# Patient Record
Sex: Female | Born: 1980 | Race: White | Hispanic: No | Marital: Married | Smoking: Never smoker
Health system: Southern US, Community
[De-identification: ages and names within clinical notes are randomized; demographics above are authoritative.]

## PROBLEM LIST (undated history)

## (undated) DIAGNOSIS — N200 Calculus of kidney: Secondary | ICD-10-CM

## (undated) DIAGNOSIS — B019 Varicella without complication: Secondary | ICD-10-CM

## (undated) DIAGNOSIS — N390 Urinary tract infection, site not specified: Secondary | ICD-10-CM

## (undated) HISTORY — DX: Calculus of kidney: N20.0

## (undated) HISTORY — DX: Varicella without complication: B01.9

## (undated) HISTORY — DX: Urinary tract infection, site not specified: N39.0

---

## 2005-01-07 ENCOUNTER — Emergency Department (HOSPITAL_COMMUNITY): Admission: EM | Admit: 2005-01-07 | Discharge: 2005-01-07 | Payer: Self-pay | Admitting: Emergency Medicine

## 2010-09-17 ENCOUNTER — Ambulatory Visit
Admission: RE | Admit: 2010-09-17 | Discharge: 2010-09-17 | Disposition: A | Discharge status: Home / Self Care | Payer: BC Managed Care – PPO | Source: Ambulatory Visit | Attending: Family Medicine | Admitting: Family Medicine

## 2010-09-17 ENCOUNTER — Other Ambulatory Visit: Payer: Self-pay | Admitting: Family Medicine

## 2010-09-17 DIAGNOSIS — M549 Dorsalgia, unspecified: Secondary | ICD-10-CM

## 2010-09-17 DIAGNOSIS — M545 Low back pain: Secondary | ICD-10-CM

## 2010-09-17 DIAGNOSIS — N3000 Acute cystitis without hematuria: Secondary | ICD-10-CM

## 2011-03-21 ENCOUNTER — Other Ambulatory Visit: Payer: Self-pay | Admitting: Sports Medicine

## 2011-03-21 DIAGNOSIS — M5412 Radiculopathy, cervical region: Secondary | ICD-10-CM

## 2011-03-25 ENCOUNTER — Ambulatory Visit
Admission: RE | Admit: 2011-03-25 | Discharge: 2011-03-25 | Disposition: A | Discharge status: Home / Self Care | Payer: BC Managed Care – PPO | Source: Ambulatory Visit | Attending: Sports Medicine | Admitting: Sports Medicine

## 2011-03-25 DIAGNOSIS — M5412 Radiculopathy, cervical region: Secondary | ICD-10-CM

## 2013-12-25 ENCOUNTER — Ambulatory Visit (INDEPENDENT_AMBULATORY_CARE_PROVIDER_SITE_OTHER): Payer: BC Managed Care – PPO | Admitting: Family Medicine

## 2013-12-25 ENCOUNTER — Encounter: Payer: Self-pay | Admitting: Family Medicine

## 2013-12-25 VITALS — BP 124/74 | HR 104 | Temp 98.4°F | Ht 62.6 in | Wt 221.0 lb

## 2013-12-25 DIAGNOSIS — R059 Cough, unspecified: Secondary | ICD-10-CM

## 2013-12-25 DIAGNOSIS — R053 Chronic cough: Secondary | ICD-10-CM

## 2013-12-25 DIAGNOSIS — R05 Cough: Secondary | ICD-10-CM

## 2013-12-25 DIAGNOSIS — K219 Gastro-esophageal reflux disease without esophagitis: Secondary | ICD-10-CM

## 2013-12-25 DIAGNOSIS — N200 Calculus of kidney: Secondary | ICD-10-CM

## 2013-12-25 NOTE — Patient Instructions (Signed)
Elevated head of bed about 6 to 8 inches Avoid eating within 2 -3 hours of bedtime Consider over the counter Nexium or Prilosec for the next few days   Gastroesophageal Reflux Disease, Adult Gastroesophageal reflux disease (GERD) happens when acid from your stomach flows up into the esophagus. When acid comes in contact with the esophagus, the acid causes soreness (inflammation) in the esophagus. Over time, GERD may create small holes (ulcers) in the lining of the esophagus. CAUSES   Increased body weight. This puts pressure on the stomach, making acid rise from the stomach into the esophagus.  Smoking. This increases acid production in the stomach.  Drinking alcohol. This causes decreased pressure in the lower esophageal sphincter (valve or ring of muscle between the esophagus and stomach), allowing acid from the stomach into the esophagus.  Late evening meals and a full stomach. This increases pressure and acid production in the stomach.  A malformed lower esophageal sphincter. Sometimes, no cause is found. SYMPTOMS   Burning pain in the lower part of the mid-chest behind the breastbone and in the mid-stomach area. This may occur twice a week or more often.  Trouble swallowing.  Sore throat.  Dry cough.  Asthma-like symptoms including chest tightness, shortness of breath, or wheezing. DIAGNOSIS  Your caregiver may be able to diagnose GERD based on your symptoms. In some cases, X-rays and other tests may be done to check for complications or to check the condition of your stomach and esophagus. TREATMENT  Your caregiver may recommend over-the-counter or prescription medicines to help decrease acid production. Ask your caregiver before starting or adding any new medicines.  HOME CARE INSTRUCTIONS   Change the factors that you can control. Ask your caregiver for guidance concerning weight loss, quitting smoking, and alcohol consumption.  Avoid foods and drinks that make your  symptoms worse, such as:  Caffeine or alcoholic drinks.  Chocolate.  Peppermint or mint flavorings.  Garlic and onions.  Spicy foods.  Citrus fruits, such as oranges, lemons, or limes.  Tomato-based foods such as sauce, chili, salsa, and pizza.  Fried and fatty foods.  Avoid lying down for the 3 hours prior to your bedtime or prior to taking a nap.  Eat small, frequent meals instead of large meals.  Wear loose-fitting clothing. Do not wear anything tight around your waist that causes pressure on your stomach.  Raise the head of your bed 6 to 8 inches with wood blocks to help you sleep. Extra pillows will not help.  Only take over-the-counter or prescription medicines for pain, discomfort, or fever as directed by your caregiver.  Do not take aspirin, ibuprofen, or other nonsteroidal anti-inflammatory drugs (NSAIDs). SEEK IMMEDIATE MEDICAL CARE IF:   You have pain in your arms, neck, jaw, teeth, or back.  Your pain increases or changes in intensity or duration.  You develop nausea, vomiting, or sweating (diaphoresis).  You develop shortness of breath, or you faint.  Your vomit is green, yellow, black, or looks like coffee grounds or blood.  Your stool is red, bloody, or black. These symptoms could be signs of other problems, such as heart disease, gastric bleeding, or esophageal bleeding. MAKE SURE YOU:   Understand these instructions.  Will watch your condition.  Will get help right away if you are not doing well or get worse. Document Released: 01/26/2005 Document Revised: 07/11/2011 Document Reviewed: 11/05/2010 [facility] Patient Information 2015 [place], [place]. This information is not intended to replace advice given to you by your health  care provider. Make sure you discuss any questions you have with your health care provider.  

## 2013-12-25 NOTE — Progress Notes (Signed)
Pre visit review using our clinic review tool, if applicable. No additional management support is needed unless otherwise documented below in the visit note. 

## 2013-12-25 NOTE — Progress Notes (Signed)
   Subjective:    Patient ID: Sharon Rosario, female    DOB: [birth year], 33 y.o.   MRN: [id number]  HPI Patient here to establish care. She has past history of kidney stones x2. No other chronic medical problems. Takes no regular medications. She's had some issues with menorrhagia over the past year and has seen her gynecologist. She's not on any hormone treatment at this time. She has never smoked.  Maj. issue is cough for about the past 6 weeks. She's had some chronic cough over the past. Never smoked. No clear etiology. No history of asthma. No obvious wheezing. No ACE use. No postnasal drip symptoms. She was treated by another physician with Zithromax and several allergy medicines without improvement. No asthma history. She does have some occasional GERD symptoms. Cough is day and night.  Cough occurs in multiple environments. No clear triggers. Denies any recent appetite changes. She's had gradual weight gain during the past year. She has several labs including thyroid functions from OB/GYN a little over a year ago reportedly normal.  Past Medical History  Diagnosis Date  . Chicken pox   . Kidney stone   . Urinary tract infection    History reviewed. No pertinent past surgical history.  reports that she has never smoked. She does not have any smokeless tobacco history on file. She reports that she drinks alcohol. She reports that she does not use illicit drugs. family history includes Arthritis in her maternal grandfather; Cancer in her paternal grandfather; Heart disease in her maternal grandmother. Allergies  Allergen Reactions  . Codeine   . Sulfa Antibiotics       Review of Systems  Constitutional: Negative for fever, chills, appetite change and unexpected weight change.  HENT: Negative for congestion, postnasal drip and voice change.   Respiratory: Positive for cough. Negative for shortness of breath and wheezing.   Cardiovascular: Negative for chest pain,  palpitations and leg swelling.  Neurological: Negative for dizziness and headaches.  Hematological: Negative for adenopathy.       Objective:   Physical Exam  Constitutional: She appears well-developed and well-nourished. No distress.  HENT:  Right Ear: External ear normal.  Left Ear: External ear normal.  Mouth/Throat: Oropharynx is clear and moist.  Neck: Neck supple. No thyromegaly present.  Cardiovascular: Normal rate and regular rhythm.   Pulmonary/Chest: Effort normal and breath sounds normal. No respiratory distress. She has no wheezes. She has no rales.  Musculoskeletal: She exhibits no edema.  Lymphadenopathy:    She has no cervical adenopathy.          Assessment & Plan:  Chronic cough. Suspect probable GERD component. Elevate head of bed 6-8 inches. Try over-the-counter Nexium or Prilosec for the next several weeks. GERD handout given. Minimize caffeine use. Chest x-ray -given duration of cough. Touch base if symptoms not resolving over the next few weeks. We discussed her high BMI and relevance to GERD risk and have recommended she lose some weight.

## 2013-12-26 DIAGNOSIS — K219 Gastro-esophageal reflux disease without esophagitis: Secondary | ICD-10-CM

## 2013-12-26 DIAGNOSIS — N200 Calculus of kidney: Secondary | ICD-10-CM

## 2013-12-26 DIAGNOSIS — E669 Obesity, unspecified: Secondary | ICD-10-CM | POA: Insufficient documentation

## 2013-12-26 HISTORY — DX: Calculus of kidney: N20.0

## 2013-12-26 HISTORY — DX: Obesity, unspecified: E66.9

## 2013-12-26 HISTORY — DX: Gastro-esophageal reflux disease without esophagitis: K21.9

## 2014-04-07 ENCOUNTER — Ambulatory Visit (INDEPENDENT_AMBULATORY_CARE_PROVIDER_SITE_OTHER): Payer: BC Managed Care – PPO | Admitting: Family Medicine

## 2014-04-07 ENCOUNTER — Telehealth: Payer: Self-pay | Admitting: Family Medicine

## 2014-04-07 ENCOUNTER — Encounter: Payer: Self-pay | Admitting: Family Medicine

## 2014-04-07 VITALS — BP 110/80 | HR 72 | Temp 98.2°F | Ht 62.6 in | Wt 223.7 lb

## 2014-04-07 DIAGNOSIS — J069 Acute upper respiratory infection, unspecified: Secondary | ICD-10-CM

## 2014-04-07 MED ORDER — AMOXICILLIN 875 MG PO TABS: 875.0000 mg | ORAL_TABLET | Freq: Two times a day (BID) | ORAL | Status: DC

## 2014-04-07 NOTE — Progress Notes (Signed)
Pre visit review using our clinic review tool, if applicable. No additional management support is needed unless otherwise documented below in the visit note. 

## 2014-04-07 NOTE — Progress Notes (Signed)
HPI:  -started: 9 days ago, started to get better, then got hoarse voice and cough and more nasal congestion -symptoms:nasal congestion, sore throat, cough -denies:fever, SOB, NVD, tooth pain -has tried: OTC medications -sick contacts/travel/risks: denies flu exposure, tick exposure or or Ebola risks  ROS: See pertinent positives and negatives per HPI.  Past Medical History  Diagnosis Date  . Chicken pox   . Kidney stone   . Urinary tract infection     No past surgical history on file.  Family History  Problem Relation Age of Onset  . Heart disease Maternal Grandmother   . Arthritis Maternal Grandfather   . Cancer Paternal Grandfather     colon    History   Social History  . Marital Status: Married    Spouse Name: N/A    Number of Children: N/A  . Years of Education: N/A   Social History Main Topics  . Smoking status: Never Smoker   . Smokeless tobacco: None  . Alcohol Use: Yes  . Drug Use: No  . Sexual Activity: None   Other Topics Concern  . None   Social History Narrative    Current outpatient prescriptions: amoxicillin (AMOXIL) 875 MG tablet, Take 1 tablet (875 mg total) by mouth 2 (two) times daily., Disp: 20 tablet, Rfl: 0  EXAM:  Filed Vitals:   04/07/14 1059  BP: 110/80  Pulse: 72  Temp: 98.2 F (36.8 C)    Body mass index is 40.14 kg/(m^2).  GENERAL: vitals reviewed and listed above, alert, oriented, appears well hydrated and in no acute distress  HEENT: atraumatic, conjunttiva clear, no obvious abnormalities on inspection of external nose and ears, normal appearance of ear canals and TMs, clear nasal congestion, mild post oropharyngeal erythema with PND, no tonsillar edema or exudate, no sinus TTP  NECK: no obvious masses on inspection  LUNGS: clear to auscultation bilaterally, no wheezes, rales or rhonchi, good air movement  CV: HRRR, no peripheral edema  MS: moves all extremities without noticeable abnormality  PSYCH: pleasant  and cooperative, no obvious depression or anxiety  ASSESSMENT AND PLAN:  Discussed the following assessment and plan:  Acute upper respiratory infection - Plan: amoxicillin (AMOXIL) 875 MG tablet  -given HPI and exam findings today, a serious infection or illness is unlikely. We discussed potential etiologies, with VURI being most likely, and advised supportive care and monitoring. We discussed treatment side effects, likely course, antibiotic misuse, transmission, and signs of developing a serious illness. She is concerned for bacterial sinus infection - doubt, but abx  incase not improving as expected or develops symptoms of this. -of course, we advised to return or notify a doctor immediately if symptoms worsen or persist or new concerns arise.    Patient Instructions  INSTRUCTIONS FOR UPPER RESPIRATORY INFECTION:  -plenty of rest and fluids  -nasal saline wash 2-3 times daily (use prepackaged nasal saline or bottled/distilled water if making your own)   -can use AFRIN nasal spray for drainage and nasal congestion - but do NOT use longer then 3-4 days  -use antibiotic only if not getting better in next few days or if worseneing; shred prescription if you do not use  -can use tylenol or ibuprofen as directed for aches and sorethroat  -in the winter time, using a humidifier at night is helpful (please follow cleaning instructions)  -if you are taking a cough medication - use only as directed, may also try a teaspoon of honey to coat the throat and throat  lozenges  -for sore throat, salt water gargles can help  -follow up if you have fevers, facial pain, tooth pain, difficulty breathing or are worsening or not getting better in 5-7 days      KIM, HANNAH R.

## 2014-04-07 NOTE — Patient Instructions (Signed)
INSTRUCTIONS FOR UPPER RESPIRATORY INFECTION:  -plenty of rest and fluids  -nasal saline wash 2-3 times daily (use prepackaged nasal saline or bottled/distilled water if making your own)   -can use AFRIN nasal spray for drainage and nasal congestion - but do NOT use longer then 3-4 days  -use antibiotic only if not getting better in next few days or if worseneing; shred prescription if you do not use  -can use tylenol or ibuprofen as directed for aches and sorethroat  -in the winter time, using a humidifier at night is helpful (please follow cleaning instructions)  -if you are taking a cough medication - use only as directed, may also try a teaspoon of honey to coat the throat and throat lozenges  -for sore throat, salt water gargles can help  -follow up if you have fevers, facial pain, tooth pain, difficulty breathing or are worsening or not getting better in 5-7 days

## 2014-04-07 NOTE — Telephone Encounter (Signed)
Pt needs receipt printed and faxed for proof of payment for reimbursement by her insurance company. Pls fax receipt to [phone] Pt states this goes directly to her.

## 2014-06-17 ENCOUNTER — Encounter: Payer: Self-pay | Admitting: Family Medicine

## 2014-06-17 ENCOUNTER — Ambulatory Visit (INDEPENDENT_AMBULATORY_CARE_PROVIDER_SITE_OTHER): Payer: BLUE CROSS/BLUE SHIELD | Admitting: Family Medicine

## 2014-06-17 VITALS — BP 124/80 | HR 107 | Temp 97.9°F | Wt 222.0 lb

## 2014-06-17 DIAGNOSIS — J029 Acute pharyngitis, unspecified: Secondary | ICD-10-CM

## 2014-06-17 LAB — POCT RAPID STREP A (OFFICE): Rapid Strep A Screen: NEGATIVE

## 2014-06-17 NOTE — Progress Notes (Signed)
Pre visit review using our clinic review tool, if applicable. No additional management support is needed unless otherwise documented below in the visit note. 

## 2014-06-17 NOTE — Patient Instructions (Signed)

## 2014-06-17 NOTE — Progress Notes (Signed)
   Subjective:    Patient ID: Sharon Rosario, female    DOB: 09/03/1980, 34 y.o.   MRN: [id number]  HPI Acute visit for sore throat and cough. Last Friday she developed fever up to 101 and had low-grade fever of the weekend. None today. She has cough which is mostly nonproductive. She has some mild body aches. She had some nausea and a couple episodes of vomiting over the weekend but none since then. She has sensation of swelling posterior pharynx. Is keeping down fluids without difficulty. Has some postnasal drainage. No skin rash. No diarrhea  Past Medical History  Diagnosis Date  . Chicken pox   . Kidney stone   . Urinary tract infection    No past surgical history on file.  reports that she has never smoked. She does not have any smokeless tobacco history on file. She reports that she drinks alcohol. She reports that she does not use illicit drugs. family history includes Arthritis in her maternal grandfather; Cancer in her paternal grandfather; Heart disease in her maternal grandmother. Allergies  Allergen Reactions  . Codeine   . Sulfa Antibiotics       Review of Systems  Constitutional: Positive for fatigue.  HENT: Positive for congestion and sore throat.   Respiratory: Positive for cough.   Musculoskeletal: Positive for myalgias.  Skin: Negative for rash.       Objective:   Physical Exam  Constitutional: She appears well-developed and well-nourished.  HENT:  Right Ear: External ear normal.  Left Ear: External ear normal.  Oropharynx posterior erythema. Mild swelling and erythema of uvula. Tonsils are symmetrically enlarged with mild erythema  Neck: Neck supple.  Minimal anterior cervical adenopathy bilaterally  Cardiovascular: Normal rate and regular rhythm.   No murmur heard. Pulmonary/Chest: Effort normal and breath sounds normal. No respiratory distress. She has no wheezes. She has no rales.          Assessment & Plan:  Acute pharyngitis.  Suspect viral syndrome. Check rapid strep. If negative, treat symptomatically

## 2015-02-03 ENCOUNTER — Ambulatory Visit (INDEPENDENT_AMBULATORY_CARE_PROVIDER_SITE_OTHER): Payer: BLUE CROSS/BLUE SHIELD | Admitting: Family Medicine

## 2015-02-03 ENCOUNTER — Encounter: Payer: Self-pay | Admitting: Family Medicine

## 2015-02-03 VITALS — BP 121/73 | HR 124 | Temp 100.3°F | Ht 63.0 in | Wt 226.0 lb

## 2015-02-03 DIAGNOSIS — J019 Acute sinusitis, unspecified: Secondary | ICD-10-CM | POA: Diagnosis not present

## 2015-02-03 MED ORDER — AZITHROMYCIN 250 MG PO TABS: ORAL_TABLET | ORAL | Status: DC

## 2015-02-03 NOTE — Progress Notes (Signed)
Pre visit review using our clinic review tool, if applicable. No additional management support is needed unless otherwise documented below in the visit note. 

## 2015-02-03 NOTE — Progress Notes (Signed)
   Subjective:    Patient ID: Sharon Rosario, female    DOB: 08-01-1980, 34 y.o.   MRN: [id number]  HPI Here for 4 days of sinus pressure, HA, left ear pain, PND, a ST , and a dry cough. Some fever. Taking Tylenol.    Review of Systems  Constitutional: Positive for fever and chills. Negative for diaphoresis.  HENT: Positive for congestion, postnasal drip and sinus pressure.   Eyes: Negative.   Respiratory: Positive for cough.        Objective:   Physical Exam  Constitutional: She appears well-developed and well-nourished.  HENT:  Right Ear: External ear normal.  Left Ear: External ear normal.  Nose: Nose normal.  Mouth/Throat: Oropharynx is clear and moist.  Eyes: Conjunctivae are normal.  Neck: Neck supple. No thyromegaly present.  Cardiovascular: Normal rate, regular rhythm, normal heart sounds and intact distal pulses.   Pulmonary/Chest: Effort normal and breath sounds normal.  Lymphadenopathy:    She has no cervical adenopathy.          Assessment & Plan:  Sinusitis, treat with a Zpack. Add Mucinex.

## 2015-02-20 ENCOUNTER — Ambulatory Visit (INDEPENDENT_AMBULATORY_CARE_PROVIDER_SITE_OTHER): Payer: BLUE CROSS/BLUE SHIELD | Admitting: Family Medicine

## 2015-02-20 ENCOUNTER — Encounter: Payer: Self-pay | Admitting: Family Medicine

## 2015-02-20 VITALS — BP 110/80 | HR 111 | Temp 101.3°F | Wt 223.0 lb

## 2015-02-20 DIAGNOSIS — R509 Fever, unspecified: Secondary | ICD-10-CM | POA: Diagnosis not present

## 2015-02-20 DIAGNOSIS — J209 Acute bronchitis, unspecified: Secondary | ICD-10-CM | POA: Diagnosis not present

## 2015-02-20 LAB — POCT RAPID STREP A (OFFICE): Rapid Strep A Screen: NEGATIVE

## 2015-02-20 LAB — POCT INFLUENZA A/B: Influenza A, POC: NEGATIVE

## 2015-02-20 MED ORDER — AMOXICILLIN-POT CLAVULANATE 875-125 MG PO TABS: 1.0000 | ORAL_TABLET | Freq: Two times a day (BID) | ORAL | Status: DC

## 2015-02-20 NOTE — Progress Notes (Signed)
   Subjective:    Patient ID: Sharon Rosario, female    DOB: 06/05/1980, 34 y.o.   MRN: [id number]  HPI Here for the onset yesterday of fever to 102.5 degrees, body aches, a ST, and a deep cough producing green sputum. She was treated for a sinus infection on 02-03-15 with a Zpack, and this resolved.    Review of Systems  Constitutional: Positive for fever.  HENT: Positive for congestion and sore throat. Negative for postnasal drip.   Eyes: Negative.   Respiratory: Positive for cough and chest tightness. Negative for shortness of breath and wheezing.   Cardiovascular: Negative.        Objective:   Physical Exam  Constitutional: She appears well-developed and well-nourished.  Appears ill   HENT:  Right Ear: External ear normal.  Left Ear: External ear normal.  Nose: Nose normal.  Mouth/Throat: Oropharynx is clear and moist.  Eyes: Conjunctivae are normal.  Neck: Neck supple. No thyromegaly present.  Cardiovascular: Normal rate, regular rhythm, normal heart sounds and intact distal pulses.   Pulmonary/Chest: Effort normal. No respiratory distress. She has no wheezes. She has no rales.  Scattered rhonchi  Lymphadenopathy:    She has no cervical adenopathy.          Assessment & Plan:  Bronchitis, treat with Augmentin

## 2015-02-20 NOTE — Progress Notes (Signed)
Pre visit review using our clinic review tool, if applicable. No additional management support is needed unless otherwise documented below in the visit note. Influenza immunization was not given due to patient is ill.  

## 2015-02-23 ENCOUNTER — Telehealth: Payer: Self-pay | Admitting: Family Medicine

## 2015-02-23 MED ORDER — METHYLPREDNISOLONE 4 MG PO TBPK: ORAL_TABLET | ORAL | Status: DC

## 2015-02-23 MED ORDER — LEVOFLOXACIN 500 MG PO TABS: 500.0000 mg | ORAL_TABLET | Freq: Every day | ORAL | Status: DC

## 2015-02-23 NOTE — Telephone Encounter (Signed)
Patient Name: Sharon Rosario  DOB: 05/30/1980    Initial Comment caller states she recv'd amoxicillin from the dr - now both of her hands are swollen and has a rash on hands and arms   Nurse Assessment  Nurse: Stefano GaulStringer, RN, Dwana CurdVera Date/Time (Eastern Time): 02/23/2015 9:49:46 AM  Confirm and document reason for call. If symptomatic, describe symptoms. ---Caller states she has been taking amoxicillin for bronchitis. Has little red dots on her hands and arms and back. Hands are very swollen. Bumps are red and raised. Hands are painful. Stopped amoxicillin yesterday at 1 pm. [place] Started amoxicillin Friday evening.  Has the patient traveled out of the country within the last 30 days? ---No  Does the patient have any new or worsening symptoms? ---Yes  Will a triage be completed? ---Yes  Related visit to physician within the last 2 weeks? ---No  Does the PT have any chronic conditions? (i.e. diabetes, asthma, etc.) ---No  Did the patient indicate they were pregnant? ---No     Guidelines    Guideline Title Affirmed Question Affirmed Notes  Rash - Widespread On Drugs [1] Purple or blood-colored rash (spots or dots) AND [2] no fever AND [3] sounds well to triager    Final Disposition User   See Physician within 4 Hours (or PCP triage) Stefano GaulStringer, RN, Vera    Comments  Pt states [place] and is not going to urgent care or ER. Wants a new antibiotic called in for her bronchitis [address] [facility] at [address]; [place], [place]; [id number]; phone: [phone]   Referrals  GO TO FACILITY REFUSED   Disagree/Comply: Disagree  Disagree/Comply Reason: Disagree with instructions

## 2015-02-23 NOTE — Telephone Encounter (Signed)
I added Amoxicillin to her Allergy list. Tell her to take Benadryl 50 mg every 4-6 hours prn swelling and itching. Call in a Medrol dose pack. Also call in Levaquin 500 mg daily for 10 days

## 2015-02-23 NOTE — Telephone Encounter (Signed)
I spoke with pt and sent both scripts e-scribe. 

## 2015-02-24 NOTE — Telephone Encounter (Signed)
[facility] Night - Client TELEPHONE ADVICE RECORD [facility] Patient Name: Sharon SitesJENNIFER JOHNSON Gender: Female DOB: 09/03/1980 Age: [age] Y 8 M 21 D Return Phone Number: [phone] (Primary), [phone] (Secondary) Address: City/State/Zip: [place] Client [facility] Night - Client Client Site [facility] - Night Physician Gershon CraneFry, Stephen Contact Type Call Call Type Triage / Clinical Caller Name [name] ReasonMatthew Johnson Relationship To Patient Spouse Return Phone Number [phone] (Primary) Chief Complaint Paging or Request for Consult Initial Comment Caller states dr called in abx for her, it has the wrong pt address, [place]. [place], [phone]. Levofloxacin is the drug, gave her 10 Nurse Assessment Nurse: Sadie HaberBrewner, RN, Hettie Date/Time (Eastern Time): 02/23/2015 6:28:21 PM Confirm and document reason for call. If symptomatic, describe symptoms. ---traveling. Allergic rxn to abx which is pcn based. 2 rx called in. Caller states dr called in abx for her, it has the wrong pt address, husband [place] to verify. [place], [phone]. Levofloxacin is the drug, gave her 10 mg once a day. Being treated for bronchitis. Symptoms have not worsened since speaking with MD. Has the patient traveled out of the country within the last 30 days? ---Not Applicable Does the patient have any new or worsening symptoms? ---No Guidelines Guideline Title Affirmed Question Affirmed Notes Nurse Date/Time (Eastern Time) Disp. Time Lamount Cohen(Eastern Time) Disposition Final User 02/23/2015 6:38:08 PM Called On-Call Provider Brewner, RN, Hettie 02/23/2015 6:40:11 PM Clinical Call Yes Sadie HaberBrewner, RN, Hettie After Care Instructions Given Call Event Type User Date / Time Description Comments User: Iven FinnHettie, Brewner, RN Date/Time Lamount Cohen(Eastern Time): 02/23/2015 6:35:40 PM Caller asked pharm about wrong PHI, pharm said it was correct  med for correct pt, but caller still feels uncomfortable.

## 2015-02-24 NOTE — Telephone Encounter (Signed)
I spoke with pt and went over the 2 scripts that were sent to pharmacy and verified pt's correct mailing address in computer. The pharmacy had the wrong address on pt's script when she picked this up.

## 2015-05-07 ENCOUNTER — Encounter: Payer: Self-pay | Admitting: Family Medicine

## 2015-05-07 ENCOUNTER — Ambulatory Visit (INDEPENDENT_AMBULATORY_CARE_PROVIDER_SITE_OTHER): Payer: PRIVATE HEALTH INSURANCE | Admitting: Family Medicine

## 2015-05-07 VITALS — BP 120/80 | HR 88 | Temp 99.0°F | Wt 221.6 lb

## 2015-05-07 DIAGNOSIS — J0110 Acute frontal sinusitis, unspecified: Secondary | ICD-10-CM

## 2015-05-07 DIAGNOSIS — R05 Cough: Secondary | ICD-10-CM | POA: Diagnosis not present

## 2015-05-07 DIAGNOSIS — R059 Cough, unspecified: Secondary | ICD-10-CM

## 2015-05-07 DIAGNOSIS — H6502 Acute serous otitis media, left ear: Secondary | ICD-10-CM

## 2015-05-07 MED ORDER — BENZONATATE 100 MG PO CAPS: 100.0000 mg | ORAL_CAPSULE | Freq: Three times a day (TID) | ORAL | Status: DC

## 2015-05-07 MED ORDER — LEVOFLOXACIN 500 MG PO TABS: 500.0000 mg | ORAL_TABLET | Freq: Every day | ORAL | Status: DC

## 2015-05-07 NOTE — Patient Instructions (Signed)
Please complete antibiotic as directed. Increase fluids, rest, and you may use a probiotic during antibiotic therapy if needed. Mucinex can be used as well as Allegra, Zyrtec, or Claritin for symptoms of rhinitis. If symptoms do not improve in 2-3 days, worsen, or temp >101 please contact office for further evaluation.  Sinusitis, Adult Sinusitis is redness, soreness, and inflammation of the paranasal sinuses. Paranasal sinuses are air pockets within the bones of your face. They are located beneath your eyes, in the middle of your forehead, and above your eyes. In healthy paranasal sinuses, mucus is able to drain out, and air is able to circulate through them by way of your nose. However, when your paranasal sinuses are inflamed, mucus and air can become trapped. This can allow bacteria and other germs to grow and cause infection. Sinusitis can develop quickly and last only a short time (acute) or continue over a long period (chronic). Sinusitis that lasts for more than 12 weeks is considered chronic. CAUSES Causes of sinusitis include:  Allergies.  Structural abnormalities, such as displacement of the cartilage that separates your nostrils (deviated septum), which can decrease the air flow through your nose and sinuses and affect sinus drainage.  Functional abnormalities, such as when the small hairs (cilia) that line your sinuses and help remove mucus do not work properly or are not present. SIGNS AND SYMPTOMS Symptoms of acute and chronic sinusitis are the same. The primary symptoms are pain and pressure around the affected sinuses. Other symptoms include:  Upper toothache.  Earache.  Headache.  Bad breath.  Decreased sense of smell and taste.  A cough, which worsens when you are lying flat.  Fatigue.  Fever.  Thick drainage from your nose, which often is green and may contain pus (purulent).  Swelling and warmth over the affected sinuses. DIAGNOSIS Your health care provider  will perform a physical exam. During your exam, your health care provider may perform any of the following to help determine if you have acute sinusitis or chronic sinusitis:  Look in your nose for signs of abnormal growths in your nostrils (nasal polyps).  Tap over the affected sinus to check for signs of infection.  View the inside of your sinuses using an imaging device that has a light attached (endoscope). If your health care provider suspects that you have chronic sinusitis, one or more of the following tests may be recommended:  Allergy tests.  Nasal culture. A sample of mucus is taken from your nose, sent to a lab, and screened for bacteria.  Nasal cytology. A sample of mucus is taken from your nose and examined by your health care provider to determine if your sinusitis is related to an allergy. TREATMENT Most cases of acute sinusitis are related to a viral infection and will resolve on their own within 10 days. Sometimes, medicines are prescribed to help relieve symptoms of both acute and chronic sinusitis. These may include pain medicines, decongestants, nasal steroid sprays, or saline sprays. However, for sinusitis related to a bacterial infection, your health care provider will prescribe antibiotic medicines. These are medicines that will help kill the bacteria causing the infection. Rarely, sinusitis is caused by a fungal infection. In these cases, your health care provider will prescribe antifungal medicine. For some cases of chronic sinusitis, surgery is needed. Generally, these are cases in which sinusitis recurs more than 3 times per year, despite other treatments. HOME CARE INSTRUCTIONS  Drink plenty of water. Water helps thin the mucus so your sinuses  can drain more easily.  Use a humidifier.  Inhale steam 3-4 times a day (for example, sit in the bathroom with the shower running).  Apply a warm, moist washcloth to your face 3-4 times a day, or as directed by your health  care provider.  Use saline nasal sprays to help moisten and clean your sinuses.  Take medicines only as directed by your health care provider.  If you were prescribed either an antibiotic or antifungal medicine, finish it all even if you start to feel better. SEEK IMMEDIATE MEDICAL CARE IF:  You have increasing pain or severe headaches.  You have nausea, vomiting, or drowsiness.  You have swelling around your face.  You have vision problems.  You have a stiff neck.  You have difficulty breathing.   This information is not intended to replace advice given to you by your health care provider. Make sure you discuss any questions you have with your health care provider.   Document Released: 04/18/2005 Document Revised: 05/09/2014 Document Reviewed: 05/03/2011 Elsevier Interactive Patient Education [employer]2016 Elsevier Inc.

## 2015-05-07 NOTE — Progress Notes (Signed)
Subjective:    Patient ID: Sharon Rosario, female    DOB: 01/04/1981, 35 y.o.   MRN: [id number]  HPI  Sharon Rosario is a 35 year old female who presents with sinus pressure/pain, ear pressure/discomfort, runny nose with green drainage, cough that is nonproductive at this time but she has had one episode of yellow sputum, loss of sense of smell, with chills and low grade fever. Tmax reported as 99. Symptoms started about 2 weeks ago with sinus pressure and ear discomfort with runny nose and drainage starting about 6 days ago. She denies nausea, vomiting, and myalgia. Treatments include nyquil and tylenol cold and sinus which have provided limited benefit. She does not have a history of asthma but does have a history of bronchitis. She reports being about sick contacts prior to and during symptoms.    Review of Systems  Constitutional: Positive for fever and chills. Negative for fatigue.  HENT: Positive for congestion, ear pain, postnasal drip, rhinorrhea, sinus pressure and sneezing.        Ear pressure/discomfort L>R  Eyes: Negative for discharge and itching.  Respiratory: Positive for cough. Negative for shortness of breath and wheezing.   Cardiovascular: Negative for chest pain and palpitations.  Musculoskeletal: Negative for myalgias, arthralgias, neck pain and neck stiffness.  Skin: Negative for color change and rash.       Past Medical History  Diagnosis Date  . Chicken pox   . Kidney stone   . Urinary tract infection     Social History   Social History  . Marital Status: Married    Spouse Name: N/A  . Number of Children: N/A  . Years of Education: N/A   Occupational History  . Not on file.   Social History Main Topics  . Smoking status: Never Smoker   . Smokeless tobacco: Not on file  . Alcohol Use: 0.0 oz/week    0 Standard drinks or equivalent per week  . Drug Use: No  . Sexual Activity: Not on file   Other Topics Concern  . Not on file   Social  History Narrative    No past surgical history on file.  Family History  Problem Relation Age of Onset  . Heart disease Maternal Grandmother   . Arthritis Maternal Grandfather   . Cancer Paternal Grandfather     colon    Allergies  Allergen Reactions  . Amoxicillin Hives  . Codeine   . Sulfa Antibiotics     Current Outpatient Prescriptions on File Prior to Visit  Medication Sig Dispense Refill  . Norethin Ace-Eth Estrad-FE (MINASTRIN 24 FE PO) Take by mouth daily.    . methylPREDNISolone (MEDROL DOSEPAK) 4 MG TBPK tablet Take as directed (Patient not taking: Reported on 05/07/2015) 21 tablet 0   No current facility-administered medications on file prior to visit.    BP 120/80 mmHg  Pulse 88  Temp(Src) 99 F (37.2 C) (Oral)  Wt 221 lb 9.6 oz (100.517 kg)  SpO2 97%    Objective:   Physical Exam  Constitutional: She is oriented to person, place, and time. She appears well-developed and well-nourished.  HENT:  Nose: Right sinus exhibits frontal sinus tenderness. Left sinus exhibits frontal sinus tenderness.  Moderate amount of cerumen bilaterally. Left ear is noted to have fluid behind TM. TM is without redness or bulging  Eyes: Pupils are equal, round, and reactive to light.  Neck: Normal range of motion.  Cardiovascular: Normal rate, regular rhythm and normal heart  sounds.   Pulmonary/Chest: Effort normal and breath sounds normal. She has no wheezes. She has no rales.  Lymphadenopathy:    She has no cervical adenopathy.  Neurological: She is alert and oriented to person, place, and time.  Skin: Skin is warm and dry. No rash noted.          Assessment & Plan:  Acute frontal sinusitis and cough. Rx Levofloxacin due to allergy profile, and reported concern for doxycycline due to child bearing age. Patient is currently on oral contraceptives. Discussed with patient the use of mucinex and antihistamines such as Allegra, Claritin, or Zyrtec if needed for rhinitis. Also  discussed the use of flonase for rhinitis if symptoms persist. Cough is addressed with benzonatate TID. Patient advised to increase fluids, rest, and use a probiotic during antibiotic therapy if needed. Discussed the use of OTC ear wax softening drops and irrigation. Also, advised patient that fluid in ear should resolve after treatment for rhinitis and sinusitis. Instructed patient to contact clinic for further evaluation if symptoms do not improve in 2-3 days, worsen, or temp >101. She voiced understanding.

## 2015-05-07 NOTE — Progress Notes (Signed)
Pre visit review using our clinic review tool, if applicable. No additional management support is needed unless otherwise documented below in the visit note. 

## 2015-05-12 ENCOUNTER — Ambulatory Visit (INDEPENDENT_AMBULATORY_CARE_PROVIDER_SITE_OTHER): Payer: PRIVATE HEALTH INSURANCE | Admitting: Family Medicine

## 2015-05-12 ENCOUNTER — Encounter: Payer: Self-pay | Admitting: Family Medicine

## 2015-05-12 VITALS — BP 116/76 | HR 80 | Temp 98.7°F | Ht 63.0 in | Wt 222.0 lb

## 2015-05-12 DIAGNOSIS — J209 Acute bronchitis, unspecified: Secondary | ICD-10-CM

## 2015-05-12 MED ORDER — CLARITHROMYCIN 500 MG PO TABS: 500.0000 mg | ORAL_TABLET | Freq: Two times a day (BID) | ORAL | Status: DC

## 2015-05-12 MED ORDER — ALBUTEROL SULFATE HFA 108 (90 BASE) MCG/ACT IN AERS: 2.0000 | INHALATION_SPRAY | RESPIRATORY_TRACT | Status: DC | PRN

## 2015-05-12 MED ORDER — METHYLPREDNISOLONE 4 MG PO TBPK: ORAL_TABLET | ORAL | Status: DC

## 2015-05-12 NOTE — Progress Notes (Signed)
Pre visit review using our clinic review tool, if applicable. No additional management support is needed unless otherwise documented below in the visit note. 

## 2015-05-12 NOTE — Progress Notes (Signed)
   Subjective:    Patient ID: Sharon Rosario, female    DOB: 04/06/1981, 35 y.o.   MRN: [id number]  HPI Here for worsening URI symptoms. She was seen 5 days ago for sinusitis symptoms and was put on Levaquin. However now her chest is tight, she feels SOB and she wheezes when she coughs. The cough is dry. No fever.    Review of Systems  Constitutional: Negative.   HENT: Positive for congestion and sinus pressure. Negative for ear pain, postnasal drip and sore throat.   Eyes: Negative.   Respiratory: Positive for cough, chest tightness, shortness of breath and wheezing.   Cardiovascular: Negative.        Objective:   Physical Exam  Constitutional: She appears well-developed and well-nourished. No distress.  HENT:  Right Ear: External ear normal.  Left Ear: External ear normal.  Nose: Nose normal.  Mouth/Throat: Oropharynx is clear and moist.  Eyes: Conjunctivae are normal.  Neck: No thyromegaly present.  Pulmonary/Chest: Effort normal. No respiratory distress. She has no rales.  Scattered rhonchi and wheezes   Lymphadenopathy:    She has no cervical adenopathy.          Assessment & Plan:  Bronchitis. Switch from Levaquin to Biaxin. Add a Medrol dose pack and an inhaler. Recheck prn

## 2015-07-02 ENCOUNTER — Ambulatory Visit (INDEPENDENT_AMBULATORY_CARE_PROVIDER_SITE_OTHER): Payer: PRIVATE HEALTH INSURANCE | Admitting: Family Medicine

## 2015-07-02 VITALS — BP 110/82 | HR 91 | Temp 98.6°F | Ht 63.0 in | Wt 224.4 lb

## 2015-07-02 DIAGNOSIS — H9203 Otalgia, bilateral: Secondary | ICD-10-CM | POA: Diagnosis not present

## 2015-07-02 NOTE — Patient Instructions (Signed)
Earache An earache, also called otalgia, can be caused by many things. Pain from an earache can be sharp, dull, or burning. The pain may be temporary or constant. Earaches can be caused by problems with the ear, such as infection in either the middle ear or the ear canal, injury, impacted ear wax, middle ear pressure, or a foreign body in the ear. Ear pain can also result from problems in other areas. This is called referred pain. For example, pain can come from a sore throat, a tooth infection, or problems with the jaw or the joint between the jaw and the skull (temporomandibular joint, or TMJ). The cause of an earache is not always easy to identify. Watchful waiting may be appropriate for some earaches until a clear cause of the pain can be found. HOME CARE INSTRUCTIONS Watch your condition for any changes. The following actions may help to lessen any discomfort that you are feeling:  Take medicines only as directed by your health care provider. This includes ear drops.  Apply ice to your outer ear to help reduce pain.  Put ice in a plastic bag.  Place a towel between your skin and the bag.  Leave the ice on for 20 minutes, 2-3 times per day.  Do not put anything in your ear other than medicine that is prescribed by your health care provider.  Try resting in an upright position instead of lying down. This may help to reduce pressure in the middle ear and relieve pain.  Chew gum if it helps to relieve your ear pain.  Control any allergies that you have.  Keep all follow-up visits as directed by your health care provider. This is important. SEEK MEDICAL CARE IF:  Your pain does not improve within 2 days.  You have a fever.  You have new or worsening symptoms. SEEK IMMEDIATE MEDICAL CARE IF:  You have a severe headache.  You have a stiff neck.  You have difficulty swallowing.  You have redness or swelling behind your ear.  You have drainage from your ear.  You have hearing  loss.  You feel dizzy.   This information is not intended to replace advice given to you by your health care provider. Make sure you discuss any questions you have with your health care provider.   Document Released: 12/04/2003 Document Revised: 05/09/2014 Document Reviewed: 11/17/2013 Elsevier Interactive Patient Education 2016 [employer].  Continue with Motrin as needed.  Also might consider Sudafed to see if this relieves any pressure.

## 2015-07-02 NOTE — Progress Notes (Signed)
Pre visit review using our clinic review tool, if applicable. No additional management support is needed unless otherwise documented below in the visit note. 

## 2015-07-02 NOTE — Progress Notes (Signed)
   Subjective:    Patient ID: Sharon Rosario, female    DOB: Nov 29, 1980, 35 y.o.   MRN: [id number]  HPI  patient seen with bilateral ear pain.  Onset couple days ago. No ear drainage.  No hearing changes. No vertigo.  She describes achy discomfort both ears. Denies any sinus congestion  no fevers or chills. Temporarily relieved with Valsalva maneuver.  No recent flying. No exacerbating factors.  Past Medical History  Diagnosis Date  . Chicken pox   . Kidney stone   . Urinary tract infection    No past surgical history on file.  reports that she has never smoked. She has never used smokeless tobacco. She reports that she drinks alcohol. She reports that she does not use illicit drugs. family history includes Arthritis in her maternal grandfather; Cancer in her paternal grandfather; Heart disease in her maternal grandmother. Allergies  Allergen Reactions  . Amoxicillin Hives  . Codeine   . Sulfa Antibiotics       Review of Systems  Constitutional: Negative for fever and chills.  HENT: Positive for ear pain. Negative for hearing loss.   Respiratory: Negative for shortness of breath.   Neurological: Negative for dizziness and headaches.       Objective:   Physical Exam  Constitutional: She appears well-developed and well-nourished.  HENT:  Mouth/Throat: Oropharynx is clear and moist.  Minimal cerumen both canals. Eardrums are mostly visualized and are gray with normal landmarks. No obvious effusion. Ear canals are normal No TMJ tenderness  Neck: Neck supple.  Cardiovascular: Normal rate and regular rhythm.   Pulmonary/Chest: Effort normal and breath sounds normal. No respiratory distress. She has no wheezes. She has no rales.          Assessment & Plan:   Bilateral otalgia. Etiology unclear. Question bilateral eustachian tube dysfunction. Continue Motrin as needed and consider over-the-counter Sudafed. She does not have evidence for  suppurative or serous  changes

## 2015-08-03 ENCOUNTER — Telehealth: Payer: Self-pay | Admitting: Family Medicine

## 2015-08-03 NOTE — Telephone Encounter (Signed)
Boyd KerbsPenny,  Can you assist with this please?

## 2015-08-03 NOTE — Telephone Encounter (Signed)
Pt saw Amil AmenJulia before she was credentialed with [employer]. Amil AmenJulia was credentialed on 1/13 with them and claim  was submitted 05/07/15.  Pt was charged at out of network cost. Mrs Manson PasseyBrown suggest resubmit with corrected MD and they will process.   Please send directly to [employer] Attn:  Loma SousaWanda Brown Fax: [phone]

## 2015-08-06 NOTE — Telephone Encounter (Signed)
Burna MortimerWanda with [employer] is following up on this request. She would like to expedite is you can fax directly to the number below.  Thank you!

## 2015-08-12 NOTE — Telephone Encounter (Signed)
Marchelle FolksAmanda, [facility], and Ms Manson PasseyBrown has called several times to follow up.  Anything else we can do?

## 2015-08-13 NOTE — Telephone Encounter (Signed)
[facility]. I will also send to charge correction.

## 2015-10-06 ENCOUNTER — Encounter: Payer: Self-pay | Admitting: Family Medicine

## 2015-10-06 ENCOUNTER — Ambulatory Visit (INDEPENDENT_AMBULATORY_CARE_PROVIDER_SITE_OTHER): Payer: PRIVATE HEALTH INSURANCE | Admitting: Family Medicine

## 2015-10-06 VITALS — BP 124/90 | HR 106 | Temp 98.3°F | Ht 63.0 in | Wt 230.1 lb

## 2015-10-06 DIAGNOSIS — J209 Acute bronchitis, unspecified: Secondary | ICD-10-CM

## 2015-10-06 MED ORDER — ALBUTEROL SULFATE HFA 108 (90 BASE) MCG/ACT IN AERS
2.0000 | INHALATION_SPRAY | RESPIRATORY_TRACT | Status: DC | PRN
Start: 2015-10-06 — End: 2016-12-06

## 2015-10-06 MED ORDER — BENZONATATE 200 MG PO CAPS: 200.0000 mg | ORAL_CAPSULE | Freq: Three times a day (TID) | ORAL | Status: DC | PRN

## 2015-10-06 NOTE — Progress Notes (Signed)
Pre visit review using our clinic review tool, if applicable. No additional management support is needed unless otherwise documented below in the visit note. 

## 2015-10-06 NOTE — Patient Instructions (Signed)

## 2015-10-06 NOTE — Progress Notes (Signed)
   Subjective:    Patient ID: Sharon Rosario, female    DOB: 07/11/1980, 35 y.o.   MRN: [id number]  HPI Patient is nonsmoker seen with 2 week history of cough. Cough is been dry and nonproductive. She thinks she's had some wheezing off and on. No history of asthma. Denies any associated fever or chills. Minimal nasal congestion. Past history of GERD which has been controlled with lifestyle modification. Cough is been more intense at night. Took Tylenol Cold and sinus with slight relief. Denies any hemoptysis. No pleuritic pain. Initially had some sore throat but those symptoms have resolved.  Past Medical History  Diagnosis Date  . Chicken pox   . Kidney stone   . Urinary tract infection    No past surgical history on file.  reports that she has never smoked. She has never used smokeless tobacco. She reports that she drinks alcohol. She reports that she does not use illicit drugs. family history includes Arthritis in her maternal grandfather; Cancer in her paternal grandfather; Heart disease in her maternal grandmother. Allergies  Allergen Reactions  . Amoxicillin Hives  . Codeine   . Sulfa Antibiotics       Review of Systems  Constitutional: Negative for fever and chills.  HENT: Negative for congestion and sore throat.   Respiratory: Positive for cough and wheezing.   Cardiovascular: Negative for chest pain, palpitations and leg swelling.       Objective:   Physical Exam  Constitutional: She appears well-developed and well-nourished. No distress.  HENT:  Right Ear: External ear normal.  Left Ear: External ear normal.  Mouth/Throat: Oropharynx is clear and moist.  Neck: Neck supple.  Cardiovascular: Normal rate and regular rhythm.   Pulmonary/Chest: Effort normal and breath sounds normal. No respiratory distress. She has no wheezes. She has no rales.  Musculoskeletal: She exhibits no edema.  Lymphadenopathy:    She has no cervical adenopathy.         Assessment & Plan:  Cough. Reported wheezing but none noted on exam at this time. Suspect acute viral bronchitis. Nonfocal exam. Tessalon Perles 2 mg every 8 hours as needed for cough. Prescription for albuterol inhaler 2 puffs every 4-6 hours as needed for any recurrent wheezing. Follow-up promptly for any fever or new symptoms  Kristian CoveyBruce W Audryanna Zurita MD [facility]

## 2016-03-21 ENCOUNTER — Encounter: Payer: Self-pay | Admitting: Family Medicine

## 2016-03-21 ENCOUNTER — Ambulatory Visit (INDEPENDENT_AMBULATORY_CARE_PROVIDER_SITE_OTHER): Payer: PRIVATE HEALTH INSURANCE | Admitting: Family Medicine

## 2016-03-21 VITALS — BP 121/77 | HR 72 | Temp 98.7°F | Ht 63.0 in | Wt 234.0 lb

## 2016-03-21 DIAGNOSIS — J209 Acute bronchitis, unspecified: Secondary | ICD-10-CM | POA: Diagnosis not present

## 2016-03-21 MED ORDER — HYDROCODONE-HOMATROPINE 5-1.5 MG/5ML PO SYRP: 5.0000 mL | ORAL_SOLUTION | ORAL | 0 refills | Status: DC | PRN

## 2016-03-21 MED ORDER — AZITHROMYCIN 250 MG PO TABS: ORAL_TABLET | ORAL | 0 refills | Status: DC

## 2016-03-21 NOTE — Progress Notes (Signed)
   Subjective:    Patient ID: Sharon Rosario, female    DOB: 02/18/1981, 35 y.o.   MRN: [id number]  HPI Here for one week of stuffy head, PND, chest tightness and coughing up yellow sputum. Using Tylenol Cold and Sinus.   Review of Systems  Constitutional: Negative.   HENT: Positive for congestion and postnasal drip. Negative for sinus pressure and sore throat.   Eyes: Negative.   Respiratory: Positive for cough and chest tightness. Negative for shortness of breath.        Objective:   Physical Exam  Constitutional: She appears well-developed and well-nourished.  HENT:  Right Ear: External ear normal.  Left Ear: External ear normal.  Nose: Nose normal.  Mouth/Throat: Oropharynx is clear and moist.  Eyes: Conjunctivae are normal.  Neck: No thyromegaly present.  Pulmonary/Chest: Effort normal. No respiratory distress. She has no wheezes. She has no rales.  Scattered rhonchi   Lymphadenopathy:    She has no cervical adenopathy.          Assessment & Plan:  Bronchitis, treat with a Zpack. Nelwyn SalisburyFRY,Khush Pasion A, MD

## 2016-05-30 ENCOUNTER — Ambulatory Visit (INDEPENDENT_AMBULATORY_CARE_PROVIDER_SITE_OTHER)
Admission: RE | Admit: 2016-05-30 | Discharge: 2016-05-30 | Disposition: A | Discharge status: Home / Self Care | Payer: PRIVATE HEALTH INSURANCE | Source: Ambulatory Visit | Attending: Family Medicine | Admitting: Family Medicine

## 2016-05-30 ENCOUNTER — Ambulatory Visit (INDEPENDENT_AMBULATORY_CARE_PROVIDER_SITE_OTHER): Payer: PRIVATE HEALTH INSURANCE | Admitting: Family Medicine

## 2016-05-30 VITALS — BP 122/76 | HR 110 | Temp 98.7°F | Ht 63.0 in | Wt 241.0 lb

## 2016-05-30 DIAGNOSIS — R059 Cough, unspecified: Secondary | ICD-10-CM

## 2016-05-30 DIAGNOSIS — R05 Cough: Secondary | ICD-10-CM

## 2016-05-30 DIAGNOSIS — R0609 Other forms of dyspnea: Secondary | ICD-10-CM

## 2016-05-30 DIAGNOSIS — R000 Tachycardia, unspecified: Secondary | ICD-10-CM

## 2016-05-30 DIAGNOSIS — R0789 Other chest pain: Secondary | ICD-10-CM | POA: Diagnosis not present

## 2016-05-30 LAB — CBC WITH DIFFERENTIAL/PLATELET
BASOS ABS: 0 10*3/uL (ref 0.0–0.1)
Basophils Relative: 0.4 % (ref 0.0–3.0)
Eosinophils Absolute: 0.3 10*3/uL (ref 0.0–0.7)
Eosinophils Relative: 2.7 % (ref 0.0–5.0)
HCT: 39.6 % (ref 36.0–46.0)
Hemoglobin: 13.2 g/dL (ref 12.0–15.0)
LYMPHS ABS: 2.1 10*3/uL (ref 0.7–4.0)
Lymphocytes Relative: 22.6 % (ref 12.0–46.0)
MCHC: 33.3 g/dL (ref 30.0–36.0)
MCV: 85.1 fl (ref 78.0–100.0)
MONO ABS: 0.4 10*3/uL (ref 0.1–1.0)
Monocytes Relative: 4.1 % (ref 3.0–12.0)
NEUTROS PCT: 70.2 % (ref 43.0–77.0)
Neutro Abs: 6.6 10*3/uL (ref 1.4–7.7)
Platelets: 285 10*3/uL (ref 150.0–400.0)
RBC: 4.65 Mil/uL (ref 3.87–5.11)
RDW: 14 % (ref 11.5–15.5)
WBC: 9.4 10*3/uL (ref 4.0–10.5)

## 2016-05-30 LAB — TSH: TSH: 1.91 u[IU]/mL (ref 0.35–4.50)

## 2016-05-30 NOTE — Progress Notes (Signed)
Subjective:     Patient ID: Sharon Rosario, female   DOB: 12/08/1980, 36 y.o.   MRN: [id number]  HPI Patient seen with onset about a week ago of some gradual worsening of shortness of breath and diffuse chest tightness bilaterally. She has had some cough but cough has been relatively mild. She had some leftover Ventolin which she took without relief. She does note that with activity her symptoms seem to be somewhat worse. She denies any orthopnea. No pleuritic pain. Nonsmoker. No hemoptysis. Denies fever or chills. No nausea or vomiting. No, pain.  She does have occasional GERD and thought initially this may be related to increased gas. Took Gas-X over-the-counter without relief. She denies any recent leg or calf edema or pain. She is on oral contraception. Has never smoked. She states her heart rate has been up slightly recently. Her normal pulses usually 70-80 and has been around 100.  Past Medical History:  Diagnosis Date  . Chicken pox   . Kidney stone   . Urinary tract infection    No past surgical history on file.  reports that she has never smoked. She has never used smokeless tobacco. She reports that she drinks alcohol. She reports that she does not use drugs. family history includes Arthritis in her maternal grandfather; Cancer in her paternal grandfather; Heart disease in her maternal grandmother. Allergies  Allergen Reactions  . Amoxicillin Hives  . Codeine   . Sulfa Antibiotics      Review of Systems  Constitutional: Negative for fatigue and unexpected weight change.  HENT: Negative for trouble swallowing.   Eyes: Negative for visual disturbance.  Respiratory: Positive for chest tightness and shortness of breath. Negative for cough and wheezing.   Cardiovascular: Negative for palpitations and leg swelling.  Gastrointestinal: Negative for abdominal pain, nausea and vomiting.  Neurological: Negative for dizziness, seizures, syncope, weakness, light-headedness and  headaches.       Objective:   Physical Exam  Constitutional: She appears well-developed and well-nourished. No distress.  Neck: Neck supple.  Cardiovascular: Normal rate and regular rhythm.   Pulmonary/Chest: Effort normal and breath sounds normal. No respiratory distress. She has no wheezes. She has no rales.  Musculoskeletal: She exhibits no edema.  Lymphadenopathy:    She has no cervical adenopathy.       Assessment:     Atypical chest pain and exertional dyspnea. She is not presenting with typical bronchitis type symptoms.  Non-focal exam and pulse oximetry 99%.  No reactive airway changes.  Doubt PE but she is on OCPs.  EKG sinus rhythm with no acute changes    Plan:     -obtain EKG (results as above) -Obtain chest x-ray -Check d-dimer, CBC, TSH (mild tachycardia) -She has some leftover Hydromet cough syrup which she will take for cough as needed -Follow-up immediately for any fever or increasing shortness of breath.  Kristian CoveyBruce W Burchette MD [facility]

## 2016-05-30 NOTE — Progress Notes (Signed)
Pre visit review using our clinic review tool, if applicable. No additional management support is needed unless otherwise documented below in the visit note. 

## 2016-05-30 NOTE — Patient Instructions (Signed)
Follow up for any fever, increased shortness of breath, or other concerns. 

## 2016-05-31 LAB — D-DIMER, QUANTITATIVE (NOT AT ARMC): D DIMER QUANT: 0.35 ug{FEU}/mL (ref <0.50)

## 2016-10-07 LAB — CBC AND DIFFERENTIAL: HEMOGLOBIN: 12.5 (ref 12.0–16.0)

## 2016-10-07 LAB — HM PAP SMEAR

## 2016-10-10 ENCOUNTER — Encounter: Payer: Self-pay | Admitting: Family Medicine

## 2016-12-06 ENCOUNTER — Other Ambulatory Visit: Payer: Self-pay | Admitting: Family Medicine

## 2017-01-17 ENCOUNTER — Encounter: Payer: Self-pay | Admitting: Family Medicine

## 2017-01-17 ENCOUNTER — Ambulatory Visit (INDEPENDENT_AMBULATORY_CARE_PROVIDER_SITE_OTHER): Payer: PRIVATE HEALTH INSURANCE | Admitting: Family Medicine

## 2017-01-17 VITALS — BP 110/80 | HR 99 | Temp 98.7°F | Wt 244.5 lb

## 2017-01-17 DIAGNOSIS — J019 Acute sinusitis, unspecified: Secondary | ICD-10-CM | POA: Diagnosis not present

## 2017-01-17 MED ORDER — AZITHROMYCIN 250 MG PO TABS: ORAL_TABLET | ORAL | 0 refills | Status: AC

## 2017-01-17 NOTE — Progress Notes (Signed)
Subjective:     Patient ID: Sharon Rosario, female   DOB: July 22, 1980, 36 y.o.   MRN: [id number]  HPI Patient seen for acute visit for 2 week history of facial pain, nasal congestion, headaches, and cough. She states this is "moving down into her chest ". She's had some greenish nasal discharge. Daily nasal congestion. Increased malaise. She's tried some saline irrigation and over-the-counter medications without much improvement. She has allergies to penicillin and sulfa. She has taken Zithromax with good results in the past. No history of asthma. Nonsmoker.  Past Medical History:  Diagnosis Date  . Chicken pox   . Kidney stone   . Urinary tract infection    No past surgical history on file.  reports that she has never smoked. She has never used smokeless tobacco. She reports that she drinks alcohol. She reports that she does not use drugs. family history includes Arthritis in her maternal grandfather; Cancer in her paternal grandfather; Heart disease in her maternal grandmother. Allergies  Allergen Reactions  . Amoxicillin Hives  . Codeine   . Sulfa Antibiotics      Review of Systems  Constitutional: Positive for fatigue. Negative for chills and fever.  HENT: Positive for congestion, sinus pain and sinus pressure.   Respiratory: Positive for cough.   Neurological: Positive for headaches.       Objective:   Physical Exam  Constitutional: She appears well-developed and well-nourished.  HENT:  Right Ear: External ear normal.  Left Ear: External ear normal.  Mouth/Throat: Oropharynx is clear and moist. No oropharyngeal exudate.  Neck: Neck supple.  Cardiovascular: Normal rate and regular rhythm.   Pulmonary/Chest: Effort normal and breath sounds normal. No respiratory distress. She has no wheezes. She has no rales.  Lymphadenopathy:    She has no cervical adenopathy.       Assessment:     Probable acute sinusitis    Plan:     -Patient has multiple drug  allergies as above. Start Zithromax and treat for 5 days. -We discussed increasing resistance over time with Zithromax and may need to look at doxycycline or quinolone if not improving with the above. -Continue with nasal saline irrigation  Kristian Covey MD [facility]

## 2017-01-17 NOTE — Patient Instructions (Signed)

## 2017-01-19 ENCOUNTER — Encounter: Payer: Self-pay | Admitting: Family Medicine

## 2017-01-24 ENCOUNTER — Telehealth: Payer: Self-pay | Admitting: Family Medicine

## 2017-01-24 MED ORDER — DOXYCYCLINE HYCLATE 100 MG PO TABS: 100.0000 mg | ORAL_TABLET | Freq: Two times a day (BID) | ORAL | 0 refills | Status: DC

## 2017-01-24 NOTE — Telephone Encounter (Signed)
Spoke with patient and she said she was feeling a little better after taking the medication.  Now she has a sore throat, body aches, and her cough is getting deeper in her chest with burning.

## 2017-01-24 NOTE — Telephone Encounter (Signed)
I would transition to doxycycline 100 mg twice daily for 10 days. Follow-up office visit if not improved following that

## 2017-01-24 NOTE — Telephone Encounter (Signed)
Rx sent and patient is aware. 

## 2017-01-24 NOTE — Telephone Encounter (Signed)
° ° ° °  Pt was seen on 01/17/17 and was told to call back if she didn't get any better. Pt call to say she is not any better and would like a call back

## 2017-06-15 ENCOUNTER — Encounter: Payer: Self-pay | Admitting: Family Medicine

## 2017-06-15 ENCOUNTER — Ambulatory Visit: Payer: PRIVATE HEALTH INSURANCE | Admitting: Family Medicine

## 2017-06-15 VITALS — BP 100/78 | HR 70 | Temp 98.1°F | Wt 243.5 lb

## 2017-06-15 DIAGNOSIS — A084 Viral intestinal infection, unspecified: Secondary | ICD-10-CM

## 2017-06-15 MED ORDER — ONDANSETRON HCL 4 MG PO TABS: 4.0000 mg | ORAL_TABLET | Freq: Three times a day (TID) | ORAL | 0 refills | Status: DC | PRN

## 2017-06-15 NOTE — Progress Notes (Signed)
Subjective:    Patient ID: Sharon Rosario, female    DOB: 03/07/1981, 37 y.o.   MRN: [id number]  Chief Complaint  Patient presents with  . Abdominal Cramping    HPI Patient was seen today for acute visit.  Patient endorses nausea, diarrhea, fever T-max 101, vomiting since Sunday.  Patient tried Gas-X and Tylenol.  Pt does not recall sick contacts.  Pt denies recent changes in diet. Pt is vegetarian. She has been trying to eat bland foods.  Pt does endorse more formed stools, but still has some nausea at the beginning and end of the day.  Past Medical History:  Diagnosis Date  . Chicken pox   . Kidney stone   . Urinary tract infection     Allergies  Allergen Reactions  . Amoxicillin Hives  . Codeine   . Sulfa Antibiotics     ROS General: Denies chills, night sweats, changes in weight, changes in appetite  +fever HEENT: Denies headaches, ear pain, changes in vision, rhinorrhea, sore throat CV: Denies CP, palpitations, SOB, orthopnea Pulm: Denies SOB, cough, wheezing GI: Denies abdominal pain, constipation +nausea, vomiting, diarrhea, GU: Denies dysuria, hematuria, frequency, vaginal discharge Msk: Denies muscle cramps, joint pains Neuro: Denies weakness, numbness, tingling Skin: Denies rashes, bruising Psych: Denies depression, anxiety, hallucinations     Objective:    Blood pressure 100/78, pulse 70, temperature 98.1 F (36.7 C), temperature source Oral, weight 243 lb 8 oz (110.5 kg), last menstrual period 06/04/2017.   Gen. Pleasant, well-nourished, in no distress, normal affect   HEENT: [place]/AT, face symmetric, conjunctiva clear, no scleral icterus, PERRLA, nares patent without drainage, pharynx without erythema or exudate. Lungs: no accessory muscle use, CTAB, no wheezes or rales Cardiovascular: RRR, no m/r/g, no peripheral edema Abdomen: BS present, soft, mild tenderness in the right upper quadrant and left lower quadrant./ND, no hepatosplenomegaly. Neuro:   A&Ox3, CN II-XII intact, normal gait  Wt Readings from Last 3 Encounters:  06/15/17 243 lb 8 oz (110.5 kg)  01/17/17 244 lb 8 oz (110.9 kg)  05/30/16 241 lb (109.3 kg)    Lab Results  Component Value Date   WBC 9.4 05/30/2016   HGB 12.5 10/07/2016   HCT 39.6 05/30/2016   PLT 285.0 05/30/2016   TSH 1.91 05/30/2016    Assessment/Plan:  Viral gastroenteritis  -Improving -Supportive care -Given handout - Plan: ondansetron (ZOFRAN) 4 MG tablet -Patient given RTC precautions including continued fever, worsening of symptoms, increasing abdominal pain, blood in stools. -Follow-up PRN  Abbe AmsterdamShannon Banks, MD

## 2017-06-15 NOTE — Patient Instructions (Addendum)
Viral Gastroenteritis, Adult  Viral gastroenteritis is also known as the stomach flu. This condition is caused by various viruses. These viruses can be passed from person to person very easily (are very contagious). This condition may affect your stomach, small intestine, and large intestine. It can cause sudden watery diarrhea, fever, and vomiting.  Diarrhea and vomiting can make you feel weak and cause you to become dehydrated. You may not be able to keep fluids down. Dehydration can make you tired and thirsty, cause you to have a dry mouth, and decrease how often you urinate. Older adults and people with other diseases or a weak immune system are at higher risk for dehydration.  It is important to replace the fluids that you lose from diarrhea and vomiting. If you become severely dehydrated, you may need to get fluids through an IV tube.  What are the causes?  Gastroenteritis is caused by various viruses, including rotavirus and norovirus. Norovirus is the most common cause in adults.  You can get sick by eating food, drinking water, or touching a surface contaminated with one of these viruses. You can also get sick from sharing utensils or other personal items with an infected person.  What increases the risk?  This condition is more likely to develop in people:  · Who have a weak defense system (immune system).  · Who live with one or more children who are younger than 2 years old.  · Who live in a nursing home.  · Who go on cruise ships.    What are the signs or symptoms?  Symptoms of this condition start suddenly 1-2 days after exposure to a virus. Symptoms may last a few days or as long as a week. The most common symptoms are watery diarrhea and vomiting. Other symptoms include:  · Fever.  · Headache.  · Fatigue.  · Pain in the abdomen.  · Chills.  · Weakness.  · Nausea.  · Muscle aches.  · Loss of appetite.    How is this diagnosed?  This condition is diagnosed with a medical history and physical exam. You  may also have a stool test to check for viruses or other infections.  How is this treated?  This condition typically goes away on its own. The focus of treatment is to restore lost fluids (rehydration). Your health care provider may recommend that you take an oral rehydration solution (ORS) to replace important salts and minerals (electrolytes) in your body. Severe cases of this condition may require giving fluids through an IV tube.  Treatment may also include medicine to help with your symptoms.  Follow these instructions at home:  Follow instructions from your health care provider about how to care for yourself at home.  Eating and drinking  Follow these recommendations as told by your health care provider:  · Take an ORS. This is a drink that is sold at pharmacies and retail stores.  · Drink clear fluids in small amounts as you are able. Clear fluids include water, ice chips, diluted fruit juice, and low-calorie sports drinks.  · Eat bland, easy-to-digest foods in small amounts as you are able. These foods include bananas, applesauce, rice, lean meats, toast, and crackers.  · Avoid fluids that contain a lot of sugar or caffeine, such as energy drinks, sports drinks, and soda.  · Avoid alcohol.  · Avoid spicy or fatty foods.    General instructions    · Drink enough fluid to keep your urine clear or   pale yellow.  · Wash your hands often. If soap and water are not available, use hand sanitizer.  · Make sure that all people in your household wash their hands well and often.  · Take over-the-counter and prescription medicines only as told by your health care provider.  · Rest at home while you recover.  · Watch your condition for any changes.  · Take a warm bath to relieve any burning or pain from frequent diarrhea episodes.  · Keep all follow-up visits as told by your health care provider. This is important.  Contact a health care provider if:  · You cannot keep fluids down.  · Your symptoms get worse.  · You have  new symptoms.  · You feel light-headed or dizzy.  · You have muscle cramps.  Get help right away if:  · You have chest pain.  · You feel extremely weak or you faint.  · You see blood in your vomit.  · Your vomit looks like coffee grounds.  · You have bloody or black stools or stools that look like tar.  · You have a severe headache, a stiff neck, or both.  · You have a rash.  · You have severe pain, cramping, or bloating in your abdomen.  · You have trouble breathing or you are breathing very quickly.  · Your heart is beating very quickly.  · Your skin feels cold and clammy.  · You feel confused.  · You have pain when you urinate.  · You have signs of dehydration, such as:  ? Dark urine, very little urine, or no urine.  ? Cracked lips.  ? Dry mouth.  ? Sunken eyes.  ? Sleepiness.  ? Weakness.  This information is not intended to replace advice given to you by your health care provider. Make sure you discuss any questions you have with your health care provider.  Document Released: 04/18/2005 Document Revised: 09/30/2015 Document Reviewed: 12/23/2014  Elsevier Interactive Patient Education © 2018 [employer]

## 2017-08-16 ENCOUNTER — Encounter: Payer: Self-pay | Admitting: Family Medicine

## 2017-08-16 ENCOUNTER — Ambulatory Visit: Payer: PRIVATE HEALTH INSURANCE | Admitting: Family Medicine

## 2017-08-16 VITALS — BP 102/72 | HR 99 | Temp 99.9°F | Wt 244.0 lb

## 2017-08-16 DIAGNOSIS — H66001 Acute suppurative otitis media without spontaneous rupture of ear drum, right ear: Secondary | ICD-10-CM

## 2017-08-16 MED ORDER — AZITHROMYCIN 250 MG PO TABS: ORAL_TABLET | ORAL | 0 refills | Status: DC

## 2017-08-16 NOTE — Patient Instructions (Signed)

## 2017-08-16 NOTE — Progress Notes (Signed)
Subjective:     Patient ID: Sharon Rosario, female   DOB: 09/08/1980, 37 y.o.   MRN: [id number]  HPI Patient seen with chief complaint of sore throat. For 3-4 days she's had some headache, body aches, sore throat, nasal congestion with postnasal drainage. Occasional productive cough. Increasing right ear pain. No hearing changes. No dizziness. Denies a nausea or vomiting. No sick contacts. Allergic to penicillin and sulfa  Past Medical History:  Diagnosis Date  . Chicken pox   . Kidney stone   . Urinary tract infection    No past surgical history on file.  reports that she has never smoked. She has never used smokeless tobacco. She reports that she drinks alcohol. She reports that she does not use drugs. family history includes Arthritis in her maternal grandfather; Cancer in her paternal grandfather; Heart disease in her maternal grandmother. Allergies  Allergen Reactions  . Amoxicillin Hives  . Codeine   . Sulfa Antibiotics      Review of Systems  Constitutional: Positive for fatigue and fever. Negative for chills.  HENT: Positive for congestion, ear pain and sore throat. Negative for ear discharge and hearing loss.   Respiratory: Positive for cough. Negative for shortness of breath.        Objective:   Physical Exam  Constitutional: She appears well-developed and well-nourished.  HENT:  Left Ear: External ear normal.  Mouth/Throat: Oropharynx is clear and moist.  Right eardrum reveals effusion with supperative type changes. No perforation.  Neck: Neck supple.  Cardiovascular: Normal rate and regular rhythm.  Pulmonary/Chest: Effort normal and breath sounds normal. No respiratory distress. She has no wheezes. She has no rales.  Lymphadenopathy:    She has no cervical adenopathy.       Assessment:     Probable viral URI. She has evidence for probable suppurative right otitis media    Plan:     -Start Zithromax for 5 days. She is allergic to sulfa and  penicillin. -Follow-up in 10-14 days if ear symptoms not improving  Kristian CoveyBruce W Sabien Umland MD [facility]

## 2017-08-21 ENCOUNTER — Ambulatory Visit: Payer: PRIVATE HEALTH INSURANCE | Admitting: Family Medicine

## 2017-08-21 ENCOUNTER — Encounter: Payer: Self-pay | Admitting: Family Medicine

## 2017-08-21 VITALS — BP 118/80 | HR 95 | Temp 98.3°F | Ht 63.0 in | Wt 245.2 lb

## 2017-08-21 DIAGNOSIS — J019 Acute sinusitis, unspecified: Secondary | ICD-10-CM | POA: Diagnosis not present

## 2017-08-21 MED ORDER — METHYLPREDNISOLONE 4 MG PO TBPK: ORAL_TABLET | ORAL | 0 refills | Status: DC

## 2017-08-21 MED ORDER — LEVOFLOXACIN 500 MG PO TABS: 500.0000 mg | ORAL_TABLET | Freq: Every day | ORAL | 0 refills | Status: AC

## 2017-08-21 NOTE — Progress Notes (Signed)
   Subjective:    Patient ID: Sharon Rosario, female    DOB: 01/19/1981, 37 y.o.   MRN: [id number]  HPI Here for several weeks of sinus pressure, PND, pain in either ear, and coughing up yellow sputum. She has been taking different OTC decongestants with no relief. She was seen here on 08-16-17 and was given a Zpack, but this did not help.   Review of Systems  Constitutional: Negative.   HENT: Positive for congestion, ear pain, postnasal drip, sinus pressure, sinus pain and sore throat.   Eyes: Negative.   Respiratory: Positive for cough.        Objective:   Physical Exam  Constitutional: She appears well-developed and well-nourished.  HENT:  Right Ear: External ear normal.  Left Ear: External ear normal.  Nose: Nose normal.  Mouth/Throat: Oropharynx is clear and moist.  Eyes: Conjunctivae are normal.  Neck: No thyromegaly present.  Pulmonary/Chest: Effort normal and breath sounds normal. No respiratory distress. She has no wheezes. She has no rales.  Lymphadenopathy:    She has no cervical adenopathy.          Assessment & Plan:  Sinusitis, treat with Levaquin and a Medrol dose pack.  Gershon CraneStephen Mlissa Tamayo, MD

## 2017-11-08 MED ORDER — GENERIC EXTERNAL MEDICATION
Status: DC
Start: ? — End: 2017-11-08

## 2017-11-08 MED ORDER — HEPARIN SODIUM (PORCINE) 5000 UNIT/ML IJ SOLN
5000.00 | INTRAMUSCULAR | Status: DC
Start: 2017-11-06 — End: 2017-11-08

## 2017-11-08 MED ORDER — HYDROMORPHONE HCL 1 MG/ML IJ SOLN
0.50 | INTRAMUSCULAR | Status: DC
Start: ? — End: 2017-11-08

## 2017-11-08 MED ORDER — KETOROLAC TROMETHAMINE 15 MG/ML IJ SOLN
15.00 | INTRAMUSCULAR | Status: DC
Start: ? — End: 2017-11-08

## 2017-11-08 MED ORDER — OXYCODONE HCL 5 MG PO TABS
5.00 | ORAL_TABLET | ORAL | Status: DC
Start: ? — End: 2017-11-08

## 2017-11-08 MED ORDER — ALBUTEROL SULFATE (2.5 MG/3ML) 0.083% IN NEBU
2.50 | INHALATION_SOLUTION | RESPIRATORY_TRACT | Status: DC
Start: ? — End: 2017-11-08

## 2017-11-08 MED ORDER — DEXTROSE IN LACTATED RINGERS 5 % IV SOLN
50.00 | INTRAVENOUS | Status: DC
Start: ? — End: 2017-11-08

## 2017-11-08 MED ORDER — OXYCODONE HCL 5 MG PO TABS
10.00 | ORAL_TABLET | ORAL | Status: DC
Start: ? — End: 2017-11-08

## 2018-10-02 ENCOUNTER — Telehealth: Payer: Self-pay

## 2018-10-02 ENCOUNTER — Other Ambulatory Visit: Payer: Self-pay

## 2018-10-02 ENCOUNTER — Other Ambulatory Visit: Payer: PRIVATE HEALTH INSURANCE

## 2018-10-02 ENCOUNTER — Ambulatory Visit (INDEPENDENT_AMBULATORY_CARE_PROVIDER_SITE_OTHER): Payer: PRIVATE HEALTH INSURANCE | Admitting: Family Medicine

## 2018-10-02 DIAGNOSIS — R519 Headache, unspecified: Secondary | ICD-10-CM

## 2018-10-02 DIAGNOSIS — Z20822 Contact with and (suspected) exposure to covid-19: Secondary | ICD-10-CM

## 2018-10-02 DIAGNOSIS — J0100 Acute maxillary sinusitis, unspecified: Secondary | ICD-10-CM

## 2018-10-02 DIAGNOSIS — R51 Headache: Secondary | ICD-10-CM

## 2018-10-02 MED ORDER — AZITHROMYCIN 250 MG PO TABS: ORAL_TABLET | ORAL | 0 refills | Status: AC

## 2018-10-02 NOTE — Telephone Encounter (Signed)
Dr. Burchette request COVID 19 test. 

## 2018-10-02 NOTE — Progress Notes (Signed)
Patient ID: Sharon Rosario, female   DOB: 08-29-1980, 38 y.o.   MRN: [id number]  This visit type was conducted due to national recommendations for restrictions regarding the COVID-19 pandemic in an effort to limit this patient's exposure and mitigate transmission in our community.   Virtual Visit via Telephone Note  I connected with Sharon Rosario on 10/02/18 at  8:30 AM EDT by telephone and verified that I am speaking with the correct person using two identifiers.   I discussed the limitations, risks, security and privacy concerns of performing an evaluation and management service by telephone and the availability of in person appointments. I also discussed with the patient that there may be a patient responsible charge related to this service. The patient expressed understanding and agreed to proceed.  Location patient: home Location provider: work or home office Participants present for the call: patient, provider Patient did not have a visit in the prior 7 days to address this/these issue(s).   History of Present Illness:  Patient states for about a week she has had some headaches.  She then developed particularly bad headache few nights ago and had some nausea which is atypical for her generally.  She has had some increasing sinus congestion.  She has had low-grade fever intermittently.  Some postnasal drip.  Occasional cough.  No dyspnea.  Tenderness over her maxillary sinuses.  Thickening of nasal mucus.  She was concerned she may have sinusitis but also concern because of current coronavirus pandemic.  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  Has allergy to penicillin and sulfa.  She has not had any known positive COVID-19 exposures   Observations/Objective: Patient sounds cheerful and well on the phone. I do not appreciate any SOB. Speech and thought processing are grossly intact. Patient reported vitals:  Assessment and Plan:  1 week history of progressive  symptoms as above with headache, nasal congestion, sinus pressure and low-grade fever.  Suspect acute sinusitis.  -Patient will be referred for coronavirus screening -Start Zithromax -Plenty of fluids and rest -Patient will stay quarantined and out of work until further clarification regarding her coronavirus status  Follow Up Instructions:  -As above   [id number] [id number] [id number] 11-20 [id number] 21-30 I did not refer this patient for an OV in the next 24 hours for this/these issue(s).  I discussed the assessment and treatment plan with the patient. The patient was provided an opportunity to ask questions and all were answered. The patient agreed with the plan and demonstrated an understanding of the instructions.   The patient was advised to call back or seek an in-person evaluation if the symptoms worsen or if the condition fails to improve as anticipated.  I provided 17 minutes of non-face-to-face time during this encounter.   Evelena Peat, MD

## 2018-10-03 LAB — NOVEL CORONAVIRUS, NAA: SARS-CoV-2, NAA: NOT DETECTED

## 2018-10-08 ENCOUNTER — Telehealth: Payer: Self-pay

## 2018-10-08 NOTE — Telephone Encounter (Signed)
  Results released via mychart  Copied from [place] [phone]. Topic: General - Other >> Oct 04, 2018 12:46 PM Percell Belt A wrote: Reason for CRM: pt called in to get coivid results.  [employer] left her a message to contact pcp for results

## 2019-03-16 NOTE — Progress Notes (Signed)
Cardiology Office Note:    Date:  03/18/2019   ID:  Sharon Rosario, DOB 11/30/1980, MRN [id number]  PCP:  Sharon CoveyBurchette, Bruce W, MD  Cardiologist:  Sharon Rosario.   Referring MD: Sharon CoveyBurchette, Bruce W, MD   Chief Complaint  Patient presents with  . Irregular Heart Beat  . Advice Only    Possible A. fib    History of Present Illness:    Sharon Rosario is a 38 y.o. female with a hx of palpitations and tachycardia.  In September Ms. Sharon Rosario experienced several episodes of extreme increase in heart rate under stressful circumstances.  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  She would notice that her heart would increase spontaneously and noted on the apple watch that heart rates greater than 200 were present.  These episodes would last minutes and then resolved.  This was tend to recur.  They were associated with aching in the chest.  She denies exertional related chest pain.  She has not fainted.  She has had multiple episodes although they have not been as frequent recently as in September.  She is also not as stressed as in September.  She did have spells of rapid heart rate as a child but "grew out of it".  She was told not to use caffeinated beverages.  She may have 2 glasses of wine per month.  She does not drink alcohol.  She does not smoke.  There is Sharon family history of arrhythmia.  There is a family history of cardiac conditions on her maternal grandmother's side of the family but not vascular.  Past Medical History:  Diagnosis Date  . Chicken pox   . GERD (gastroesophageal reflux disease) 12/26/2013  . Kidney stone   . Kidney stones 12/26/2013  . Obesity (BMI 30-39.9) 12/26/2013  . Urinary tract infection     History reviewed. Sharon pertinent surgical history.  Current Medications: Current Meds  Medication Sig  . Norethin Ace-Eth Estrad-FE (MINASTRIN 24 FE PO) Take by mouth daily.     Allergies:   Amoxicillin,  Codeine, and Sulfa antibiotics   Social History   Socioeconomic History  . Marital status: Married    Spouse name: Not on Rosario  . Number of children: Not on Rosario  . Years of education: Not on Rosario  . Highest education level: Not on Rosario  Occupational History  . Not on Rosario  Social Needs  . Financial resource strain: Not on Rosario  . Food insecurity    Worry: Not on Rosario    Inability: Not on Rosario  . Transportation needs    Medical: Not on Rosario    Non-medical: Not on Rosario  Tobacco Use  . Smoking status: Never Smoker  . Smokeless tobacco: Never Used  Substance and Sexual Activity  . Alcohol use: Yes    Alcohol/week: 0.0 standard drinks    Comment: occ  . Drug use: Sharon  . Sexual activity: Not on Rosario  Lifestyle  . Physical activity    Days per week: Not on Rosario    Minutes per session: Not on Rosario  . Stress: Not on Rosario  Relationships  . Social Musicianconnections    Talks on phone: Not on Rosario    Gets together: Not on Rosario    Attends religious service: Not on Rosario    Active member of club or organization: Not on Rosario    Attends meetings of clubs  or organizations: Not on Rosario    Relationship status: Not on Rosario  Other Topics Concern  . Not on Rosario  Social History Narrative  . Not on Rosario     Family History: The patient's family history includes Arthritis in her maternal grandfather; Cancer in her paternal grandfather; Heart disease in her maternal grandmother.  ROS:   Please see the history of present illness.    She snores.  She feels unrested when she awakens.  She denies chest pain with physical activity.  She has Sharon underlying history of hypertension.  All other systems reviewed and are negative.  EKGs/Labs/Other Studies Reviewed:    The following studies were reviewed today: Sharon imaging data  EKG:  EKG normal sinus rhythm with normal appearance.  Normal PR interval 164 ms.  Recent Labs: Sharon results found for requested labs within last 8760 hours.  Recent Lipid Panel  Sharon results found for: CHOL, TRIG, HDL, CHOLHDL, VLDL, LDLCALC, LDLDIRECT  Physical Exam:    VS:  BP (!) 142/82   Pulse 96   Ht 5\' 3"  (1.6 m)   Wt 255 lb 6.4 oz (115.8 kg)   SpO2 98%   BMI 45.24 kg/m     Wt Readings from Last 3 Encounters:  03/18/19 255 lb 6.4 oz (115.8 kg)  08/21/17 245 lb 3.2 oz (111.2 kg)  08/16/17 244 lb (110.7 kg)     GEN: Morbidly obese. Sharon acute distress HEENT: Normal NECK: Sharon JVD. LYMPHATICS: Sharon lymphadenopathy CARDIAC:  RRR without murmur, gallop, or edema. VASCULAR:  Normal Pulses. Sharon bruits. RESPIRATORY:  Clear to auscultation without rales, wheezing or rhonchi  ABDOMEN: Soft, non-tender, non-distended, Sharon pulsatile mass, MUSCULOSKELETAL: Sharon deformity  SKIN: Warm and dry NEUROLOGIC:  Alert and oriented x 3 PSYCHIATRIC:  Normal affect   ASSESSMENT:    1. Rapid heart rate   2. PAF (paroxysmal atrial fibrillation) (HCC)   3. Obesity (BMI 30-39.9)   4. Snoring   5. Educated about COVID-19 virus infection    PLAN:    In order of problems listed above:  1. Likely PSVT.  30-day monitor will be ordered.  May consider a Kardia App if 30-day monitoring is not successful at identifying a problem. 2. She is worried that she may have A. fib.  She does snore and is obese. 3. Consider sleep study depending upon what is discovered by monitoring. 4. Rule out sleep apnea 5. The 3W's is being honored and incorporated in her lifestyle.  30-day continuous monitor, return in 6 to 8 weeks for follow-up.  If we find significant arrhythmia, consider sleep study.  We discussed the importance of weight loss, aerobic activity, and getting greater than 6 hours sleep.   Medication Adjustments/Labs and Tests Ordered: Current medicines are reviewed at length with the patient today.  Concerns regarding medicines are outlined above.  Orders Placed This Encounter  Procedures  . Cardiac event monitor  . EKG 12-Lead   Sharon orders of the defined types were placed in  this encounter.   Patient Instructions  Medication Instructions:  Your physician recommends that you continue on your current medications as directed. Please refer to the Current Medication list given to you today.  *If you need a refill on your cardiac medications before your next appointment, please call your pharmacy*  Lab Work: None If you have labs (blood work) drawn today and your tests are completely normal, you will receive your results only by: 08/18/17 MyChart Message (if you have MyChart) OR .  A paper copy in the mail If you have any lab test that is abnormal or we need to change your treatment, we will call you to review the results.  Testing/Procedures: Your physician has recommended that you wear an event monitor. Event monitors are medical devices that record the heart's electrical activity. Doctors most often [place] these monitors to diagnose arrhythmias. Arrhythmias are problems with the speed or rhythm of the heartbeat. The monitor is a small, portable device. You can wear one while you do your normal daily activities. This is usually used to diagnose what is causing palpitations/syncope (passing out).   Follow-Up: At [facility], you and your health needs are our priority.  As part of our continuing mission to provide you with exceptional heart care, we have created designated Provider Care Teams.  These Care Teams include your primary Cardiologist (physician) and Advanced Practice Providers (APPs -  Physician Assistants and Nurse Practitioners) who all work together to provide you with the care you need, when you need it.  Your next appointment:   2 months  The format for your next appointment:   In Person  Provider:   You may see Dr. Daneen Schick or one of the following Advanced Practice Providers on your designated Care Team:    Truitt Merle, NP  Cecilie Kicks, NP  Kathyrn Drown, NP   Other Instructions      Signed, Sinclair Grooms, MD  03/18/2019 10:43 AM     [place]

## 2019-03-18 ENCOUNTER — Telehealth: Payer: Self-pay

## 2019-03-18 ENCOUNTER — Ambulatory Visit: Payer: PRIVATE HEALTH INSURANCE | Admitting: Interventional Cardiology

## 2019-03-18 ENCOUNTER — Encounter: Payer: Self-pay | Admitting: Interventional Cardiology

## 2019-03-18 ENCOUNTER — Other Ambulatory Visit: Payer: Self-pay

## 2019-03-18 VITALS — BP 142/82 | HR 96 | Ht 63.0 in | Wt 255.4 lb

## 2019-03-18 DIAGNOSIS — R0683 Snoring: Secondary | ICD-10-CM

## 2019-03-18 DIAGNOSIS — Z7189 Other specified counseling: Secondary | ICD-10-CM

## 2019-03-18 DIAGNOSIS — R000 Tachycardia, unspecified: Secondary | ICD-10-CM | POA: Diagnosis not present

## 2019-03-18 DIAGNOSIS — I480 Paroxysmal atrial fibrillation: Secondary | ICD-10-CM | POA: Diagnosis not present

## 2019-03-18 DIAGNOSIS — E669 Obesity, unspecified: Secondary | ICD-10-CM

## 2019-03-18 NOTE — Telephone Encounter (Signed)
Went over monitor instructions with pt during her o/v with Dr Tamala Julian...30 day Event monitor ordered.

## 2019-03-18 NOTE — Patient Instructions (Signed)
Medication Instructions:  Your physician recommends that you continue on your current medications as directed. Please refer to the Current Medication list given to you today.  *If you need a refill on your cardiac medications before your next appointment, please call your pharmacy*  Lab Work: None If you have labs (blood work) drawn today and your tests are completely normal, you will receive your results only by: Marland Kitchen MyChart Message (if you have MyChart) OR . A paper copy in the mail If you have any lab test that is abnormal or we need to change your treatment, we will call you to review the results.  Testing/Procedures: Your physician has recommended that you wear an event monitor. Event monitors are medical devices that record the heart's electrical activity. Doctors most often [place] these monitors to diagnose arrhythmias. Arrhythmias are problems with the speed or rhythm of the heartbeat. The monitor is a small, portable device. You can wear one while you do your normal daily activities. This is usually used to diagnose what is causing palpitations/syncope (passing out).   Follow-Up: At [facility], you and your health needs are our priority.  As part of our continuing mission to provide you with exceptional heart care, we have created designated Provider Care Teams.  These Care Teams include your primary Cardiologist (physician) and Advanced Practice Providers (APPs -  Physician Assistants and Nurse Practitioners) who all work together to provide you with the care you need, when you need it.  Your next appointment:   2 months  The format for your next appointment:   In Person  Provider:   You may see Dr. Daneen Schick or one of the following Advanced Practice Providers on your designated Care Team:    Truitt Merle, NP  Cecilie Kicks, NP  Kathyrn Drown, NP   Other Instructions

## 2019-05-27 ENCOUNTER — Ambulatory Visit: Payer: BC Managed Care – PPO | Admitting: Interventional Cardiology

## 2019-06-06 ENCOUNTER — Encounter: Payer: Self-pay | Admitting: *Deleted

## 2019-06-06 NOTE — Progress Notes (Signed)
Patient ID: Sharon Rosario, female   DOB: 22-Jun-1980, 39 y.o.   MRN: [id number] 04/29/19 Received notification from [employer].  Patient requested cancellation of monitoring service.

## 2019-06-16 DIAGNOSIS — Z03818 Encounter for observation for suspected exposure to other biological agents ruled out: Secondary | ICD-10-CM | POA: Diagnosis not present

## 2019-06-16 DIAGNOSIS — Z20828 Contact with and (suspected) exposure to other viral communicable diseases: Secondary | ICD-10-CM | POA: Diagnosis not present

## 2019-06-18 ENCOUNTER — Other Ambulatory Visit: Payer: Self-pay

## 2019-06-18 ENCOUNTER — Telehealth (INDEPENDENT_AMBULATORY_CARE_PROVIDER_SITE_OTHER): Payer: BC Managed Care – PPO | Admitting: Family Medicine

## 2019-06-18 DIAGNOSIS — R0981 Nasal congestion: Secondary | ICD-10-CM

## 2019-06-18 DIAGNOSIS — Z20828 Contact with and (suspected) exposure to other viral communicable diseases: Secondary | ICD-10-CM | POA: Diagnosis not present

## 2019-06-18 DIAGNOSIS — J029 Acute pharyngitis, unspecified: Secondary | ICD-10-CM

## 2019-06-18 DIAGNOSIS — Z20822 Contact with and (suspected) exposure to covid-19: Secondary | ICD-10-CM | POA: Diagnosis not present

## 2019-06-18 NOTE — Progress Notes (Signed)
This visit type was conducted due to national recommendations for restrictions regarding the COVID-19 pandemic in an effort to limit this patient's exposure and mitigate transmission in our community.   Virtual Visit via Telephone Note  I connected with Sharon Rosario on 06/18/19 at 10:15 AM EST by telephone and verified that I am speaking with the correct person using two identifiers.   I discussed the limitations, risks, security and privacy concerns of performing an evaluation and management service by telephone and the availability of in person appointments. I also discussed with the patient that there may be a patient responsible charge related to this service. The patient expressed understanding and agreed to proceed.  Location patient: home Location provider: work or home office Participants present for the call: patient, provider Patient did not have a visit in the prior 7 days to address this/these issue(s).   History of Present Illness: Sharon Rosario called with onset Saturday of some nasal congestion and mild sore throat.  [facility] which came back negative.  Her husband has been asymptomatic.  She has had some progressive sore throat symptoms since then and now has developed some mild cough with temperature in the 99.2 range and below.  She has not had any dyspnea.  No diarrhea.  No loss of taste or smell.  No fatigue.  No body aches.  No known sick contacts.  She is a non-smoker.  No chronic lung issues.  She does have history of obesity.  She plans to [facility] to [facility] test done  Past Medical History:  Diagnosis Date  . Chicken pox   . GERD (gastroesophageal reflux disease) 12/26/2013  . Kidney stone   . Kidney stones 12/26/2013  . Obesity (BMI 30-39.9) 12/26/2013  . Urinary tract infection    No past surgical history on file.  reports that she has never smoked. She has never used smokeless tobacco. She reports current  alcohol use. She reports that she does not use drugs. family history includes Arthritis in her maternal grandfather; Cancer in her paternal grandfather; Heart disease in her maternal grandmother. Allergies  Allergen Reactions  . Amoxicillin Hives  . Codeine   . Sulfa Antibiotics       Observations/Objective: Patient sounds cheerful and well on the phone. I do not appreciate any SOB. Speech and thought processing are grossly intact. Patient reported vitals:  Assessment and Plan:  URI symptoms of nasal congestion, sore throat, and cough.  Patient is in no distress and has no significant shortness of breath  -She already has plans to get follow-up Covid test done.  She will let Sharon Rosario know if this is positive and especially if she has any progressive symptoms such as dyspnea.  If this does prove to be Covid she might be a candidate for monoclonal get antibody infusion if her symptoms progress given her BMI over 35  Follow Up Instructions:  - as above   99441 5-10 99442 11-20 99443 21-30 I did not refer this patient for an OV in the next 24 hours for this/these issue(s).  I discussed the assessment and treatment plan with the patient. The patient was provided an opportunity to ask questions and all were answered. The patient agreed with the plan and demonstrated an understanding of the instructions.   The patient was advised to call back or seek an in-person evaluation if the symptoms worsen or if the condition fails to improve as  anticipated.  I provided 14 minutes of non-face-to-face time during this encounter.   Evelena Peat, MD

## 2019-07-05 ENCOUNTER — Ambulatory Visit: Payer: BC Managed Care – PPO | Attending: Internal Medicine

## 2019-07-05 DIAGNOSIS — Z23 Encounter for immunization: Secondary | ICD-10-CM | POA: Insufficient documentation

## 2019-07-05 NOTE — Progress Notes (Signed)
   Covid-19 Vaccination Clinic  Name:  Sharon Rosario    MRN: [id number] DOB: Aug 28, 1980  07/05/2019  Ms. Sharon Rosario was observed post Covid-19 immunization for 15 minutes without incident. She was provided with Vaccine Information Sheet and instruction to access the V-Safe system.   Ms. Sharon Rosario was instructed to call 911 with any severe reactions post vaccine: Marland Kitchen Difficulty breathing  . Swelling of face and throat  . A fast heartbeat  . A bad rash all over body  . Dizziness and weakness   Immunizations Administered    Name Date Dose VIS Date Route   Moderna COVID-19 Vaccine 07/05/2019  8:30 AM 0.5 mL 04/02/2019 Intramuscular   Manufacturer: Moderna   Lot: [id number]   NDC: 02202-669-16

## 2019-08-06 ENCOUNTER — Ambulatory Visit: Payer: BC Managed Care – PPO | Attending: Internal Medicine

## 2019-08-06 DIAGNOSIS — Z23 Encounter for immunization: Secondary | ICD-10-CM

## 2019-08-06 NOTE — Progress Notes (Signed)
   Covid-19 Vaccination Clinic  Name:  Sharon Rosario    MRN: [id number] DOB: Aug 07, 1980  08/06/2019  Ms. Sharon Rosario was observed post Covid-19 immunization for 15 minutes without incident. She was provided with Vaccine Information Sheet and instruction to access the V-Safe system.   Ms. Sharon Rosario was instructed to call 911 with any severe reactions post vaccine: Marland Kitchen Difficulty breathing  . Swelling of face and throat  . A fast heartbeat  . A bad rash all over body  . Dizziness and weakness   Immunizations Administered    Name Date Dose VIS Date Route   Moderna COVID-19 Vaccine 08/06/2019  9:06 AM 0.5 mL 04/02/2019 Intramuscular   Manufacturer: Moderna   Lot: [id number]   NDC: 05678-893-38

## 2019-11-27 DIAGNOSIS — R350 Frequency of micturition: Secondary | ICD-10-CM | POA: Diagnosis not present

## 2019-11-27 DIAGNOSIS — Z124 Encounter for screening for malignant neoplasm of cervix: Secondary | ICD-10-CM | POA: Diagnosis not present

## 2019-11-27 DIAGNOSIS — Z01419 Encounter for gynecological examination (general) (routine) without abnormal findings: Secondary | ICD-10-CM | POA: Diagnosis not present

## 2019-11-27 DIAGNOSIS — Z1151 Encounter for screening for human papillomavirus (HPV): Secondary | ICD-10-CM | POA: Diagnosis not present

## 2019-11-27 DIAGNOSIS — Z0000 Encounter for general adult medical examination without abnormal findings: Secondary | ICD-10-CM | POA: Diagnosis not present

## 2019-11-27 DIAGNOSIS — Z130 Encounter for screening for diseases of the blood and blood-forming organs and certain disorders involving the immune mechanism: Secondary | ICD-10-CM | POA: Diagnosis not present

## 2019-11-27 DIAGNOSIS — Z6841 Body Mass Index (BMI) 40.0 and over, adult: Secondary | ICD-10-CM | POA: Diagnosis not present

## 2020-01-17 DIAGNOSIS — R05 Cough: Secondary | ICD-10-CM | POA: Diagnosis not present

## 2020-01-17 DIAGNOSIS — Z03818 Encounter for observation for suspected exposure to other biological agents ruled out: Secondary | ICD-10-CM | POA: Diagnosis not present

## 2020-01-17 DIAGNOSIS — R509 Fever, unspecified: Secondary | ICD-10-CM | POA: Diagnosis not present

## 2020-01-17 DIAGNOSIS — Z20822 Contact with and (suspected) exposure to covid-19: Secondary | ICD-10-CM | POA: Diagnosis not present

## 2020-02-12 ENCOUNTER — Telehealth: Payer: BC Managed Care – PPO | Admitting: Family Medicine

## 2020-02-12 DIAGNOSIS — Z20828 Contact with and (suspected) exposure to other viral communicable diseases: Secondary | ICD-10-CM | POA: Diagnosis not present

## 2020-02-12 DIAGNOSIS — R059 Cough, unspecified: Secondary | ICD-10-CM | POA: Diagnosis not present

## 2020-02-12 DIAGNOSIS — J029 Acute pharyngitis, unspecified: Secondary | ICD-10-CM | POA: Diagnosis not present

## 2020-02-12 NOTE — Progress Notes (Signed)
Patient ID: Sharon Rosario, female   DOB: 1980-12-26, 39 y.o.   MRN: [id number]  This visit type was conducted due to national recommendations for restrictions regarding the COVID-19 pandemic in an effort to limit this patient's exposure and mitigate transmission in our community.   Virtual Visit via Video Note  I connected with Sharon Rosario on 02/12/20 at  5:00 PM EDT by a video enabled telemedicine application and verified that I am speaking with the correct person using two identifiers.  Location patient: home Location provider:work or home office Persons participating in the virtual visit: patient, provider  I discussed the limitations of evaluation and management by telemedicine and the availability of in person appointments. The patient expressed understanding and agreed to proceed.   HPI: Sharon Rosario developed some upper respiratory symptoms couple days ago of some nasal congestion, sore throat, cough. She felt worse earlier today when she got up. Sore throat has worsened. She has had some chills. No definite fever. She has had some fatigue and intermittent headaches. She did travel to a conference in [place] last week. She has had Moderna vaccination. Her husband is asymptomatic. She went earlier today to pharmacy and had Covid rapid test which came back negative. She had PCR test which is pending.  She is in no respiratory distress. She has home pulse oximeter and O2 readings 97 to 98%. No loss of taste or smell. No nausea, vomiting, or diarrhea.   ROS: See pertinent positives and negatives per HPI.  Past Medical History:  Diagnosis Date   Chicken pox    GERD (gastroesophageal reflux disease) 12/26/2013   Kidney stone    Kidney stones 12/26/2013   Obesity (BMI 30-39.9) 12/26/2013   Urinary tract infection     No past surgical history on file.  Family History  Problem Relation Age of Onset   Heart disease Maternal Grandmother    Arthritis Maternal  Grandfather    Cancer Paternal Grandfather        colon    SOCIAL HX: Non-smoker   Current Outpatient Medications:    Norethin Ace-Eth Estrad-FE (MINASTRIN 24 FE PO), Take by mouth daily., Disp: , Rfl:   EXAM:  VITALS per patient if applicable:  GENERAL: alert, oriented, appears well and in no acute distress  HEENT: atraumatic, conjunttiva clear, no obvious abnormalities on inspection of external nose and ears  NECK: normal movements of the head and neck  LUNGS: on inspection no signs of respiratory distress, breathing rate appears normal, no obvious gross SOB, gasping or wheezing  CV: no obvious cyanosis  MS: moves all visible extremities without noticeable abnormality  PSYCH/NEURO: pleasant and cooperative, no obvious depression or anxiety, speech and thought processing grossly intact  ASSESSMENT AND PLAN:  Discussed the following assessment and plan:  Upper respiratory symptoms of congestion, cough, sore throat, headache, and increased malaise. Covid PCR test pending from earlier today  -Quarantine until further clarified -Plenty of fluids and rest -Continue to monitor oxygen levels closely and be in touch if these are dropping low 90s -If Covid PCR positive low threshold to refer for monoclonal antibody infusion with her history of obesity     I discussed the assessment and treatment plan with the patient. The patient was provided an opportunity to ask questions and all were answered. The patient agreed with the plan and demonstrated an understanding of the instructions.   The patient was advised to call back or seek an in-person evaluation if the symptoms worsen or if the  condition fails to improve as anticipated.     Evelena Peat, MD

## 2020-02-14 ENCOUNTER — Telehealth: Payer: Self-pay | Admitting: Family Medicine

## 2020-02-14 ENCOUNTER — Telehealth: Payer: Self-pay

## 2020-02-14 MED ORDER — DOXYCYCLINE HYCLATE 100 MG PO TABS: 100.0000 mg | ORAL_TABLET | Freq: Two times a day (BID) | ORAL | 0 refills | Status: DC

## 2020-02-14 NOTE — Telephone Encounter (Signed)
due to e-scribe system fail, pt medication had to be called into pharmacy. Pt is aware to call pharmacy before going to pick up

## 2020-02-14 NOTE — Telephone Encounter (Signed)
This may be viral, but she has had frequent sinusitis in past.  OK to send in Doxycycline 100 mg po bid for 10 days.

## 2020-02-14 NOTE — Telephone Encounter (Signed)
Patient saw Dr. Caryl Never this week. Covid test came back negative.  Patient is requesting antibiotics because she is getting worse.  She has a conference to go to next week.  Patient is requesting to speak to someone.

## 2020-02-14 NOTE — Telephone Encounter (Signed)
Patient informed of the message below and is aware the [facility] was [facility].

## 2020-02-17 ENCOUNTER — Telehealth: Payer: Self-pay | Admitting: Family Medicine

## 2020-02-17 MED ORDER — AZITHROMYCIN 250 MG PO TABS: ORAL_TABLET | ORAL | 0 refills | Status: DC

## 2020-02-17 NOTE — Telephone Encounter (Signed)
pt had an allergic reaction to doxycycline (VIBRA-TABS) 100 MG tablet have tongue swelling, and face swelling  pt would like a z pack  [phone].

## 2020-02-17 NOTE — Telephone Encounter (Signed)
Please add Doxycycline as possible allergy.  OK to send in Zithromax (5 day Z-pack).

## 2020-02-17 NOTE — Telephone Encounter (Signed)
Please advise 

## 2020-02-28 ENCOUNTER — Other Ambulatory Visit: Payer: Self-pay

## 2020-02-28 ENCOUNTER — Telehealth: Payer: BC Managed Care – PPO | Admitting: Adult Health

## 2020-02-28 ENCOUNTER — Telehealth: Payer: Self-pay

## 2020-02-28 VITALS — BP 144/90 | HR 106 | Temp 98.4°F | Ht 63.0 in | Wt 261.0 lb

## 2020-02-28 DIAGNOSIS — J0141 Acute recurrent pansinusitis: Secondary | ICD-10-CM | POA: Diagnosis not present

## 2020-02-28 DIAGNOSIS — R059 Cough, unspecified: Secondary | ICD-10-CM

## 2020-02-28 MED ORDER — BENZONATATE 100 MG PO CAPS: 100.0000 mg | ORAL_CAPSULE | Freq: Two times a day (BID) | ORAL | 0 refills | Status: DC | PRN

## 2020-02-28 MED ORDER — PREDNISONE 10 MG PO TABS: 10.0000 mg | ORAL_TABLET | Freq: Every day | ORAL | 0 refills | Status: DC

## 2020-02-28 MED ORDER — LEVOFLOXACIN 500 MG PO TABS: 500.0000 mg | ORAL_TABLET | Freq: Every day | ORAL | 0 refills | Status: DC

## 2020-02-28 NOTE — Telephone Encounter (Signed)
Spoke with pt, she is aware of the appointment. Confirmed neg pcr & rapid covid test.

## 2020-02-28 NOTE — Telephone Encounter (Signed)
If they have had a negative covid test then ok to bring in

## 2020-02-28 NOTE — Telephone Encounter (Signed)
Would you be okay seeing pt in your virtual slot or at the end of the day? Her covid test was negative.

## 2020-02-28 NOTE — Telephone Encounter (Signed)
I left pt a voicemail letting her know about the appointment today at 3:30 and that it is in office.

## 2020-02-28 NOTE — Progress Notes (Signed)
Subjective:    Patient ID: Sharon Rosario, female    DOB: [birth year], 39 y.o.   MRN: [id number]  HPI 39 year old female who  has a past medical history of Chicken pox, GERD (gastroesophageal reflux disease) (12/26/2013), Kidney stone, Kidney stones (12/26/2013), Obesity (BMI 30-39.9) (12/26/2013), and Urinary tract infection.  She was originally seen by her PCP roughly 2 weeks ago for upper respiratory symptoms including nasal congestion, sore throat, and cough.  At this time she had a negative rapid Covid test and her PCR test came back negative a few days later.  PCP prescribed a Z-Pak, and this helped with her symptoms for a few days but then her symptoms came back.  She was then prescribed doxycycline but after taking days of this medication her tongues started to swell.  Today she reports that she continues to have nasal congestion, semiproductive cough, sinus pain and pressure, and some mild wheezing.  She denies fevers or chills.  Also experiencing bilateral ear pain after flying from [facility] earlier this week.  Review of Systems See HPI   Past Medical History:  Diagnosis Date  . Chicken pox   . GERD (gastroesophageal reflux disease) 12/26/2013  . Kidney stone   . Kidney stones 12/26/2013  . Obesity (BMI 30-39.9) 12/26/2013  . Urinary tract infection     Social History   Socioeconomic History  . Marital status: Married    Spouse name: Not on file  . Number of children: Not on file  . Years of education: Not on file  . Highest education level: Not on file  Occupational History  . Not on file  Tobacco Use  . Smoking status: Never Smoker  . Smokeless tobacco: Never Used  Substance and Sexual Activity  . Alcohol use: Yes    Alcohol/week: 0.0 standard drinks    Comment: occ  . Drug use: No  . Sexual activity: Not on file  Other Topics Concern  . Not on file  Social History Narrative  . Not on file   Social Determinants of Health   Financial Resource  Strain:   . Difficulty of Paying Living Expenses: Not on file  Food Insecurity:   . Worried About Programme researcher, broadcasting/film/video in the Last Year: Not on file  . Ran Out of Food in the Last Year: Not on file  Transportation Needs:   . Lack of Transportation (Medical): Not on file  . Lack of Transportation (Non-Medical): Not on file  Physical Activity:   . Days of Exercise per Week: Not on file  . Minutes of Exercise per Session: Not on file  Stress:   . Feeling of Stress : Not on file  Social Connections:   . Frequency of Communication with Friends and Family: Not on file  . Frequency of Social Gatherings with Friends and Family: Not on file  . Attends Religious Services: Not on file  . Active Member of Clubs or Organizations: Not on file  . Attends Banker Meetings: Not on file  . Marital Status: Not on file  Intimate Partner Violence:   . Fear of Current or Ex-Partner: Not on file  . Emotionally Abused: Not on file  . Physically Abused: Not on file  . Sexually Abused: Not on file    No past surgical history on file.  Family History  Problem Relation Age of Onset  . Heart disease Maternal Grandmother   . Arthritis Maternal Grandfather   . Cancer Paternal  Grandfather        colon    Allergies  Allergen Reactions  . Amoxicillin Hives  . Codeine   . Doxycycline Swelling    Angio edema   . Sulfa Antibiotics     Current Outpatient Medications on File Prior to Visit  Medication Sig Dispense Refill  . azithromycin (ZITHROMAX) 250 MG tablet Use as directed. 6 tablet 0  . Norethin Ace-Eth Estrad-FE (MINASTRIN 24 FE PO) Take by mouth daily.     No current facility-administered medications on file prior to visit.    BP (!) 144/90   Pulse (!) 106   Temp 98.4 F (36.9 C) (Oral)   Ht 5\' 3"  (1.6 m)   Wt 261 lb (118.4 kg)   LMP 02/06/2020 (Exact Date)   SpO2 96%   BMI 46.23 kg/m       Objective:   Physical Exam Vitals and nursing note reviewed.   Constitutional:      Appearance: Normal appearance.  HENT:     Right Ear: A middle ear effusion is present. Tympanic membrane is not erythematous or bulging.     Left Ear: A middle ear effusion is present. Tympanic membrane is not erythematous or bulging.     Nose: Congestion present. No rhinorrhea.     Right Turbinates: Enlarged and swollen.     Left Turbinates: Enlarged and swollen.     Right Sinus: Maxillary sinus tenderness and frontal sinus tenderness present.     Left Sinus: Maxillary sinus tenderness and frontal sinus tenderness present.     Mouth/Throat:     Mouth: Mucous membranes are moist.     Pharynx: Oropharyngeal exudate (mildly ) present.     Tonsils: No tonsillar exudate or tonsillar abscesses.  Cardiovascular:     Rate and Rhythm: Normal rate and regular rhythm.     Pulses: Normal pulses.     Heart sounds: Normal heart sounds.  Pulmonary:     Effort: Pulmonary effort is normal.     Breath sounds: Normal breath sounds.  Neurological:     General: No focal deficit present.     Mental Status: She is alert and oriented to person, place, and time.  Psychiatric:        Mood and Affect: Mood normal.        Behavior: Behavior normal.        Thought Content: Thought content normal.        Judgment: Judgment normal.        Assessment & Plan:  1. Acute recurrent pansinusitis -New allergy to doxycycline, also allergic to amoxicillin and sulfa drugs.  Z-Pak was not strong enough to resolve her sinus infection.  She has been prescribed Levaquin in the past so we will prescribe this as well as Tessalon Perles for her cough and prednisone to help with her symptoms.  Advise follow-up with PCP if no improvement - levofloxacin (LEVAQUIN) 500 MG tablet; Take 1 tablet (500 mg total) by mouth daily.  Dispense: 7 tablet; Refill: 0 - predniSONE (DELTASONE) 10 MG tablet; Take 1 tablet (10 mg total) by mouth daily with breakfast.  Dispense: 7 tablet; Refill: 0  2. Cough  -  predniSONE (DELTASONE) 10 MG tablet; Take 1 tablet (10 mg total) by mouth daily with breakfast.  Dispense: 7 tablet; Refill: 0 - benzonatate (TESSALON) 100 MG capsule; Take 1 capsule (100 mg total) by mouth 2 (two) times daily as needed for cough.  Dispense: 20 capsule; Refill: 0  04/07/2020, NP

## 2020-02-28 NOTE — Telephone Encounter (Signed)
Patient called in wanting to let Dr know that medication that was given worked for a few days and is now back feeling bad. Was wanting to come into the office to be seen in person. Is very upset about not being able to come into the office to get her lungs listened too or seen. Want to know if a provider will see her in person today or at least call her back with some different options    Please call and advise

## 2020-03-07 DIAGNOSIS — M79641 Pain in right hand: Secondary | ICD-10-CM | POA: Diagnosis not present

## 2020-03-07 DIAGNOSIS — S6721XA Crushing injury of right hand, initial encounter: Secondary | ICD-10-CM | POA: Diagnosis not present

## 2020-03-07 DIAGNOSIS — S6710XA Crushing injury of unspecified finger(s), initial encounter: Secondary | ICD-10-CM | POA: Diagnosis not present

## 2020-10-09 ENCOUNTER — Ambulatory Visit: Payer: BC Managed Care – PPO | Admitting: Family Medicine

## 2020-10-09 ENCOUNTER — Other Ambulatory Visit: Payer: Self-pay

## 2020-10-09 VITALS — BP 130/80 | HR 81 | Temp 98.5°F | Wt 267.9 lb

## 2020-10-09 DIAGNOSIS — R1031 Right lower quadrant pain: Secondary | ICD-10-CM | POA: Diagnosis not present

## 2020-10-09 DIAGNOSIS — M545 Low back pain, unspecified: Secondary | ICD-10-CM

## 2020-10-09 LAB — POCT URINALYSIS DIPSTICK
Bilirubin, UA: NEGATIVE
Blood, UA: POSITIVE
Glucose, UA: NEGATIVE
Ketones, UA: NEGATIVE
Leukocytes, UA: NEGATIVE
Nitrite, UA: NEGATIVE
Protein, UA: NEGATIVE
Spec Grav, UA: 1.02 (ref 1.010–1.025)
Urobilinogen, UA: 0.2 E.U./dL
pH, UA: 6 (ref 5.0–8.0)

## 2020-10-09 LAB — MICROALBUMIN / CREATININE URINE RATIO
Creatinine,U: 91.9 mg/dL
Microalb Creat Ratio: 0.8 mg/g (ref 0.0–30.0)
Microalb, Ur: 0.8 mg/dL (ref 0.0–1.9)

## 2020-10-09 MED ORDER — TAMSULOSIN HCL 0.4 MG PO CAPS: 0.4000 mg | ORAL_CAPSULE | Freq: Every day | ORAL | 0 refills | Status: DC

## 2020-10-09 NOTE — Patient Instructions (Signed)
Stay well hydrated  We have ordered CT to further evaluate  Start the Flomax  Follow up immediately for any fever or worsening pain.

## 2020-10-09 NOTE — Progress Notes (Signed)
 Established Patient Office Visit  Subjective:  Patient ID: Sharon Rosario, female    DOB: 01/22/1981  Age: 40 y.o. MRN: [id number]  CC:  Chief Complaint  Patient presents with   Nephrolithiasis    Hx of kidney stones, low back pain and pressure x 1 week     HPI [name] presents for approximately 3-week history of some right flank pain.  She describes a pressure-like discomfort with some radiation anteriorly but mostly in the right flank region.  Some mild urine frequency.  She has had kidney stones twice in the past once around 2005 and second one in 2012 and this pain has not been as intense.  No gross hematuria.  No fevers or chills.  Her main concern is that she is traveling next week with her work to [place] and wanted to try to address this ahead of time.  Denies any right upper quadrant anterior abdominal pain.  No nausea or vomiting.  No recent fevers or chills.  No appetite or weight changes.  She has had previous appendectomy.  Still has her gallbladder.  Past Medical History:  Diagnosis Date   Chicken pox    GERD (gastroesophageal reflux disease) 12/26/2013   Kidney stone    Kidney stones 12/26/2013   Obesity (BMI 30-39.9) 12/26/2013   Urinary tract infection     No past surgical history on file.  Family History  Problem Relation Age of Onset   Heart disease Maternal Grandmother    Arthritis Maternal Grandfather    Cancer Paternal Grandfather        colon    Social History   Socioeconomic History   Marital status: Married    Spouse name: Not on file   Number of children: Not on file   Years of education: Not on file   Highest education level: Not on file  Occupational History   Not on file  Tobacco Use   Smoking status: Never   Smokeless tobacco: Never  Substance and Sexual Activity   Alcohol use: Yes    Alcohol/week: 0.0 standard drinks    Comment: occ   Drug use: No   Sexual activity: Not on file  Other Topics Concern   Not  on file  Social History Narrative   Not on file   Social Determinants of Health   Financial Resource Strain: Not on file  Food Insecurity: Not on file  Transportation Needs: Not on file  Physical Activity: Not on file  Stress: Not on file  Social Connections: Not on file  Intimate Partner Violence: Not on file    Outpatient Medications Prior to Visit  Medication Sig Dispense Refill   Norethin Ace-Eth Estrad-FE (MINASTRIN 24 FE PO) Take by mouth daily.     azithromycin (ZITHROMAX) 250 MG tablet Use as directed. 6 tablet 0   benzonatate (TESSALON) 100 MG capsule Take 1 capsule (100 mg total) by mouth 2 (two) times daily as needed for cough. 20 capsule 0   levofloxacin (LEVAQUIN) 500 MG tablet Take 1 tablet (500 mg total) by mouth daily. 7 tablet 0   predniSONE (DELTASONE) 10 MG tablet Take 1 tablet (10 mg total) by mouth daily with breakfast. 7 tablet 0   No facility-administered medications prior to visit.    Allergies  Allergen Reactions   Amoxicillin Hives   Codeine    Doxycycline Swelling    Angio edema    Sulfa Antibiotics     ROS Review of Systems    Constitutional:  Negative for chills and fever.  Respiratory:  Negative for shortness of breath.   Cardiovascular:  Negative for chest pain.  Gastrointestinal:  Negative for abdominal pain.  Genitourinary:  Positive for flank pain and frequency. Negative for dysuria.     Objective:    Physical Exam Cardiovascular:     Rate and Rhythm: Normal rate and regular rhythm.  Pulmonary:     Effort: Pulmonary effort is normal.     Breath sounds: Normal breath sounds.  Abdominal:     Palpations: Abdomen is soft.     Tenderness: There is no guarding or rebound.     Comments: No right upper quadrant tenderness.  Minimal tenderness right lower quadrant.  Neurological:     Mental Status: She is alert.    BP 130/80 (BP Location: Left Arm, Patient Position: Sitting, Cuff Size: Normal)   Pulse 81   Temp 98.5 F (36.9 C)  (Oral)   Wt 267 lb 14.4 oz (121.5 kg)   SpO2 97%   BMI 47.46 kg/m  Wt Readings from Last 3 Encounters:  10/09/20 267 lb 14.4 oz (121.5 kg)  02/28/20 261 lb (118.4 kg)  03/18/19 255 lb 6.4 oz (115.8 kg)     Health Maintenance Due  Topic Date Due   HIV Screening  Never done   Hepatitis C Screening  Never done   TETANUS/TDAP  Never done   PAP SMEAR-Modifier  10/08/2019   COVID-19 Vaccine (3 - Booster for Moderna series) 01/06/2020    There are no preventive care reminders to display for this patient.  Lab Results  Component Value Date   TSH 1.91 05/30/2016   Lab Results  Component Value Date   WBC 9.4 05/30/2016   HGB 12.5 10/07/2016   HCT 39.6 05/30/2016   MCV 85.1 05/30/2016   PLT 285.0 05/30/2016   No results found for: NA, K, CHLORIDE, CO2, GLUCOSE, BUN, CREATININE, BILITOT, ALKPHOS, AST, ALT, PROT, ALBUMIN, CALCIUM, ANIONGAP, EGFR, GFR No results found for: CHOL No results found for: HDL No results found for: LDLCALC No results found for: TRIG No results found for: CHOLHDL No results found for: HGBA1C    Assessment & Plan:   Right flank pain-past history of kidney stones.  Clinically, sounds like relatively low risk but her urine dipstick does show positive blood with no leukocytes and no nitrites.  We will send for urine microscopy.  Given duration of symptoms set up CT renal stone study.  We elected to start back her Flomax 0.4 mg 1 daily and increase hydration.  Follow-up promptly for any fever or worsening pain  No orders of the defined types were placed in this encounter.   Follow-up: No follow-ups on file.    Carolann Littler, MD

## 2020-10-09 NOTE — Addendum Note (Signed)
Addended by: Kandra Nicolas on: 10/09/2020 09:36 AM   Modules accepted: Orders

## 2020-10-12 ENCOUNTER — Ambulatory Visit (INDEPENDENT_AMBULATORY_CARE_PROVIDER_SITE_OTHER)
Admission: RE | Admit: 2020-10-12 | Discharge: 2020-10-12 | Disposition: A | Discharge status: Home / Self Care | Payer: BC Managed Care – PPO | Source: Ambulatory Visit | Attending: Family Medicine | Admitting: Family Medicine

## 2020-10-12 ENCOUNTER — Other Ambulatory Visit: Payer: Self-pay

## 2020-10-12 DIAGNOSIS — K760 Fatty (change of) liver, not elsewhere classified: Secondary | ICD-10-CM | POA: Diagnosis not present

## 2020-10-12 DIAGNOSIS — K429 Umbilical hernia without obstruction or gangrene: Secondary | ICD-10-CM | POA: Diagnosis not present

## 2020-10-12 DIAGNOSIS — N200 Calculus of kidney: Secondary | ICD-10-CM | POA: Diagnosis not present

## 2020-10-12 DIAGNOSIS — Z9049 Acquired absence of other specified parts of digestive tract: Secondary | ICD-10-CM | POA: Diagnosis not present

## 2020-10-12 DIAGNOSIS — R1031 Right lower quadrant pain: Secondary | ICD-10-CM | POA: Diagnosis not present

## 2020-10-15 ENCOUNTER — Telehealth: Payer: BC Managed Care – PPO | Admitting: Family Medicine

## 2020-10-15 DIAGNOSIS — M5441 Lumbago with sciatica, right side: Secondary | ICD-10-CM | POA: Diagnosis not present

## 2020-10-23 DIAGNOSIS — Z20822 Contact with and (suspected) exposure to covid-19: Secondary | ICD-10-CM | POA: Diagnosis not present

## 2020-10-25 DIAGNOSIS — Z6841 Body Mass Index (BMI) 40.0 and over, adult: Secondary | ICD-10-CM | POA: Diagnosis not present

## 2020-10-25 DIAGNOSIS — U071 COVID-19: Secondary | ICD-10-CM | POA: Diagnosis not present

## 2020-10-31 ENCOUNTER — Other Ambulatory Visit: Payer: Self-pay | Admitting: Family Medicine

## 2020-11-26 DIAGNOSIS — R053 Chronic cough: Secondary | ICD-10-CM | POA: Diagnosis not present

## 2020-11-26 DIAGNOSIS — U099 Post covid-19 condition, unspecified: Secondary | ICD-10-CM | POA: Diagnosis not present

## 2020-12-03 DIAGNOSIS — J22 Unspecified acute lower respiratory infection: Secondary | ICD-10-CM | POA: Diagnosis not present

## 2020-12-03 DIAGNOSIS — R053 Chronic cough: Secondary | ICD-10-CM | POA: Diagnosis not present

## 2020-12-03 DIAGNOSIS — U099 Post covid-19 condition, unspecified: Secondary | ICD-10-CM | POA: Diagnosis not present

## 2020-12-07 ENCOUNTER — Other Ambulatory Visit: Payer: Self-pay | Admitting: Family Medicine

## 2020-12-22 DIAGNOSIS — R053 Chronic cough: Secondary | ICD-10-CM | POA: Diagnosis not present

## 2020-12-22 DIAGNOSIS — U099 Post covid-19 condition, unspecified: Secondary | ICD-10-CM | POA: Diagnosis not present

## 2021-03-05 DIAGNOSIS — Z13220 Encounter for screening for lipoid disorders: Secondary | ICD-10-CM | POA: Diagnosis not present

## 2021-03-05 DIAGNOSIS — Z1389 Encounter for screening for other disorder: Secondary | ICD-10-CM | POA: Diagnosis not present

## 2021-03-05 DIAGNOSIS — Z01419 Encounter for gynecological examination (general) (routine) without abnormal findings: Secondary | ICD-10-CM | POA: Diagnosis not present

## 2021-03-05 DIAGNOSIS — Z124 Encounter for screening for malignant neoplasm of cervix: Secondary | ICD-10-CM | POA: Diagnosis not present

## 2021-03-05 DIAGNOSIS — Z130 Encounter for screening for diseases of the blood and blood-forming organs and certain disorders involving the immune mechanism: Secondary | ICD-10-CM | POA: Diagnosis not present

## 2021-03-05 DIAGNOSIS — Z23 Encounter for immunization: Secondary | ICD-10-CM | POA: Diagnosis not present

## 2021-03-05 DIAGNOSIS — Z13228 Encounter for screening for other metabolic disorders: Secondary | ICD-10-CM | POA: Diagnosis not present

## 2021-03-05 DIAGNOSIS — Z1231 Encounter for screening mammogram for malignant neoplasm of breast: Secondary | ICD-10-CM | POA: Diagnosis not present

## 2021-03-05 DIAGNOSIS — Z6841 Body Mass Index (BMI) 40.0 and over, adult: Secondary | ICD-10-CM | POA: Diagnosis not present

## 2021-03-05 DIAGNOSIS — E559 Vitamin D deficiency, unspecified: Secondary | ICD-10-CM | POA: Diagnosis not present

## 2022-09-01 IMAGING — CT CT RENAL STONE PROTOCOL
2 of 4 series · 16 of 46 positions shown, 18 images · non-contrast
Comparison: CT abdomen pelvis dated September 17, 2010.

CLINICAL DATA: Intermittent right flank and lower quadrant pain for
the past 3 weeks. History of kidney stones.

EXAM:
CT ABDOMEN AND PELVIS WITHOUT CONTRAST
TECHNIQUE: Multidetector CT imaging of the abdomen and pelvis was performed
following the standard protocol without IV contrast.

[Series 2: stone study 5.0 br38 1 · axial · 0.81mm/px · z∈[-469,-29]mm · 13 of 98 slices shown, 15 images]
[im 5/98  soft-tissue]
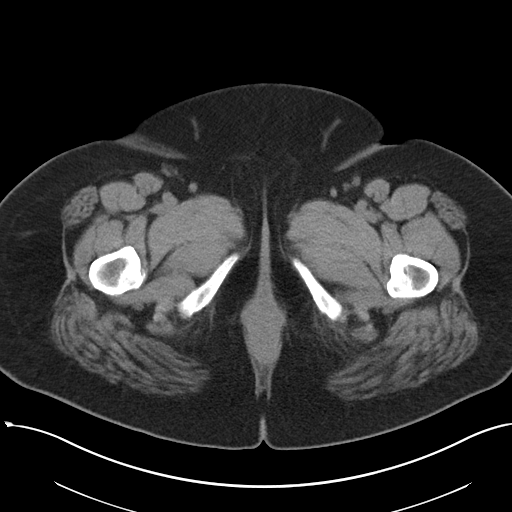
[im 5/98  bone]
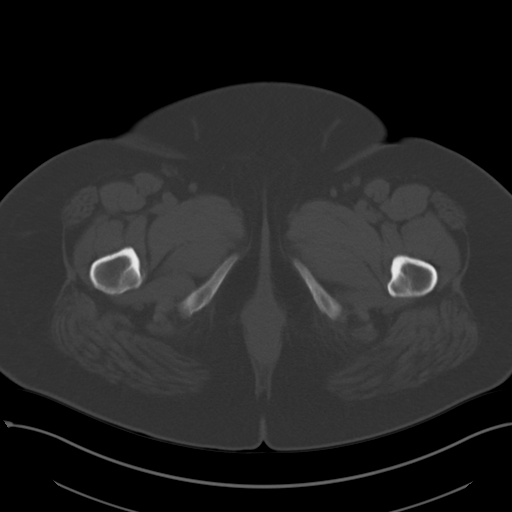
[im 13/98  soft-tissue]
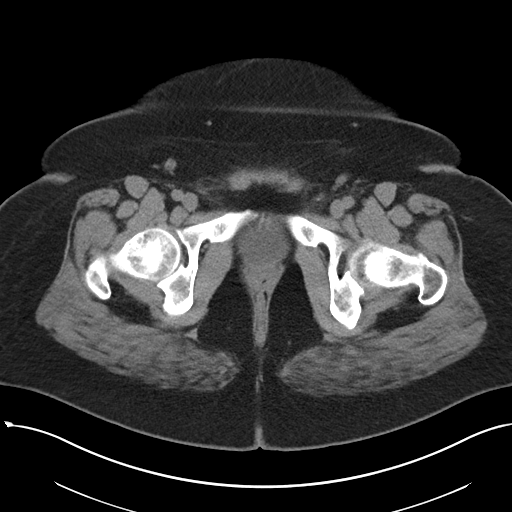
[im 22/98  soft-tissue]
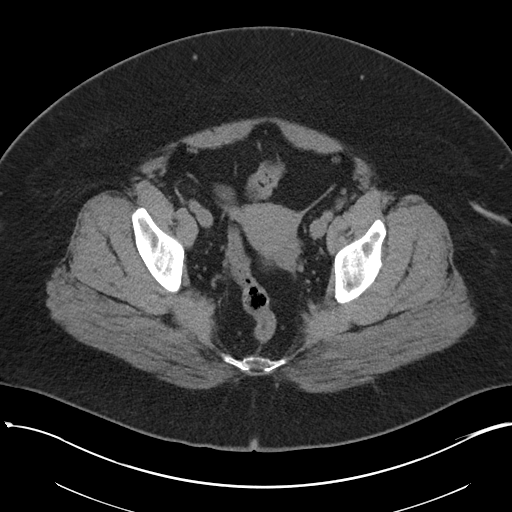
[im 26/98  soft-tissue]
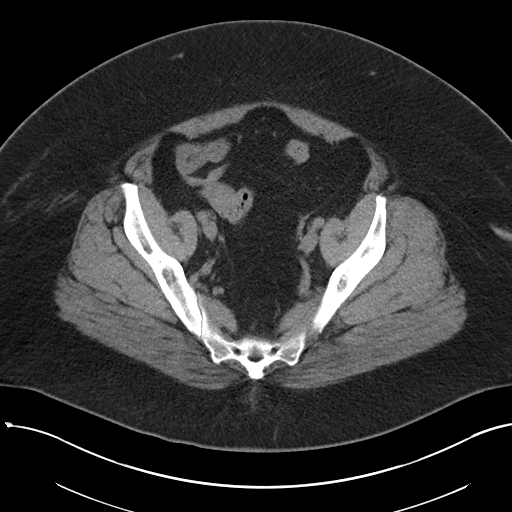
[im 34/98  soft-tissue]
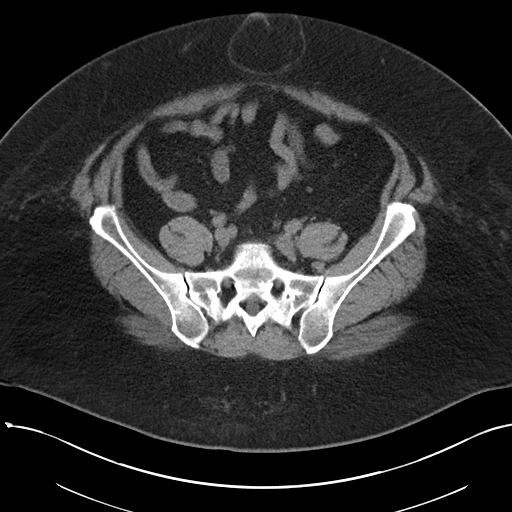
[im 43/98  soft-tissue]
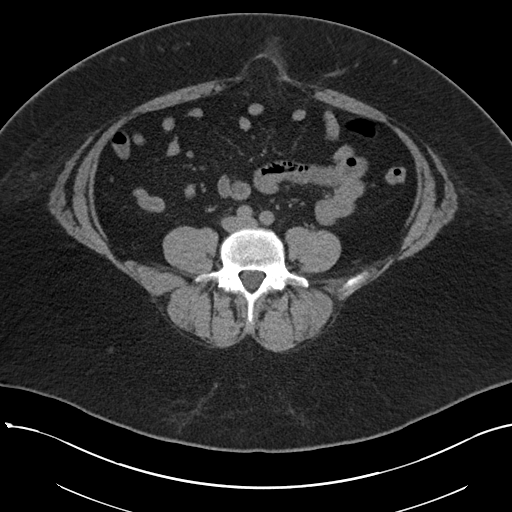
[im 51/98  soft-tissue]
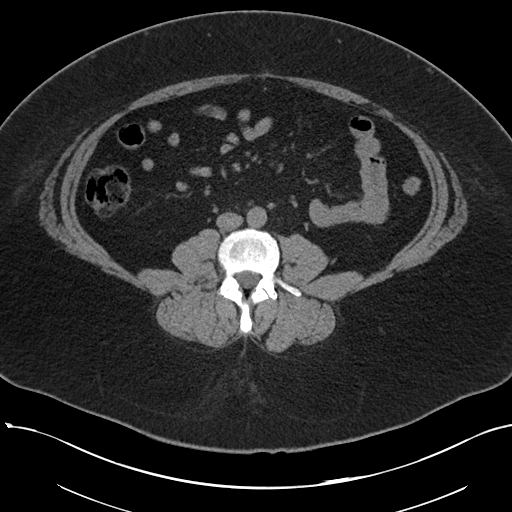
[im 55/98  soft-tissue]
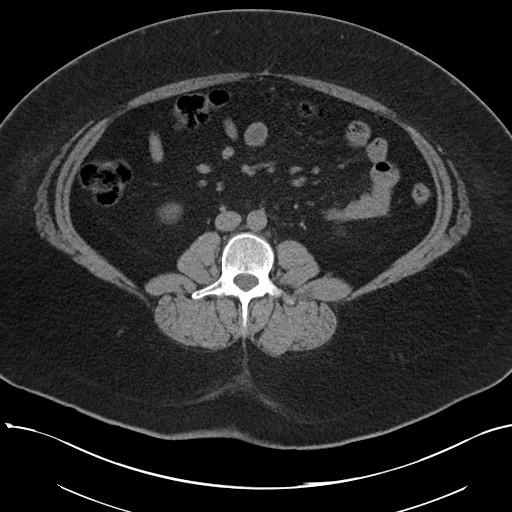
[im 64/98  soft-tissue]
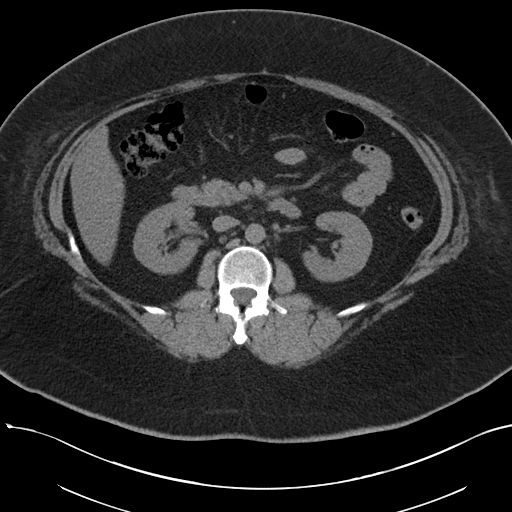
[im 64/98  bone]
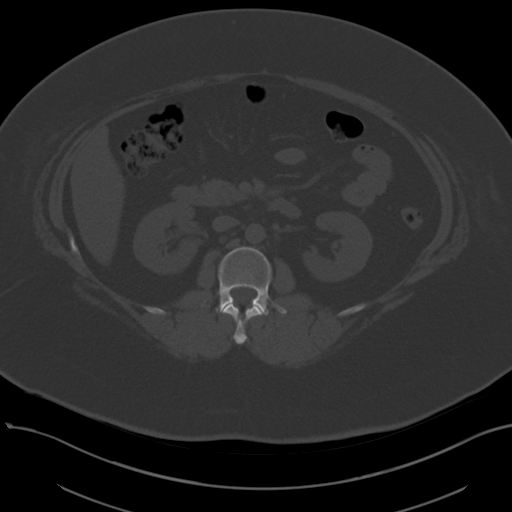
[im 72/98  soft-tissue]
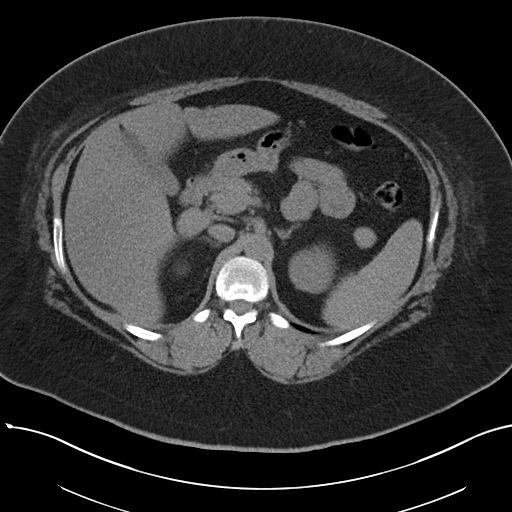
[im 76/98  soft-tissue]
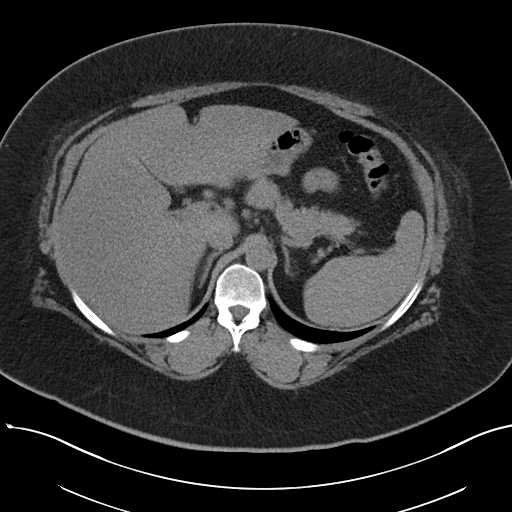
[im 85/98  soft-tissue]
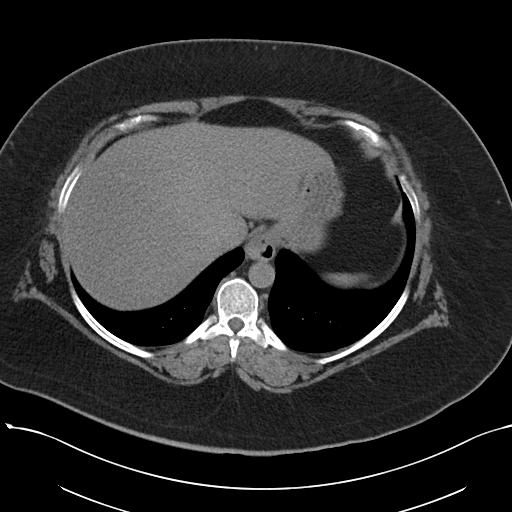
[im 93/98  soft-tissue]
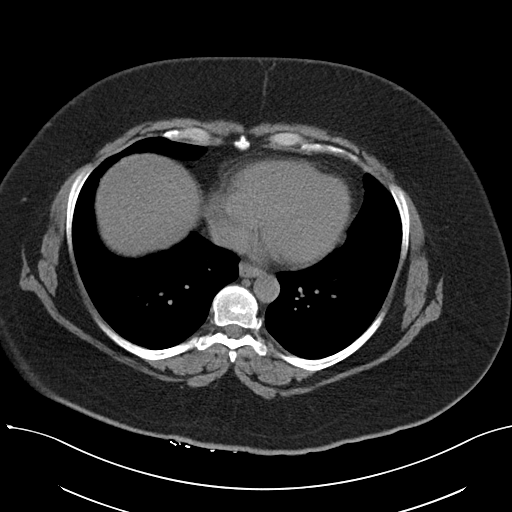

[Series 5: coronal soft tissue · coronal · 0.74mm/px · 3 of 105 slices shown]
[im 35/105  soft-tissue]
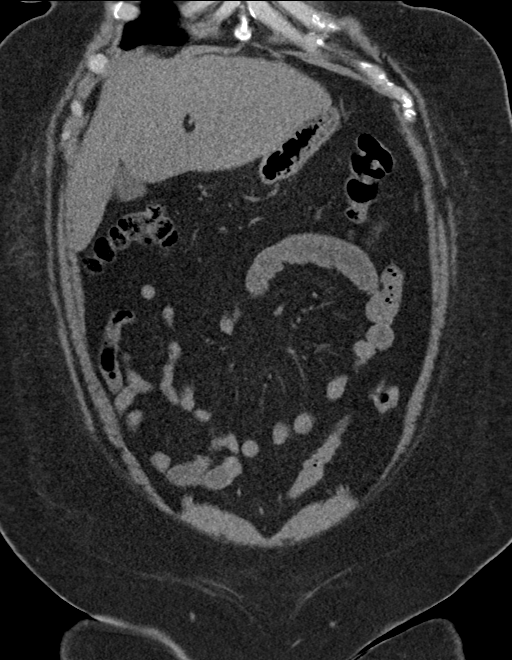
[im 47/105  soft-tissue]
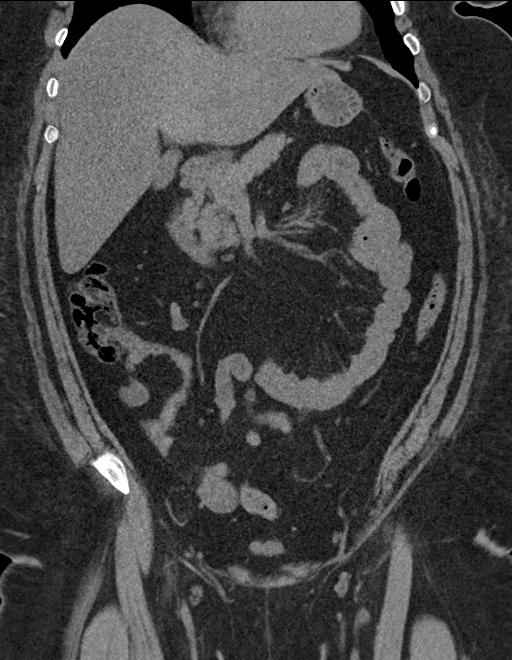
[im 58/105  soft-tissue]
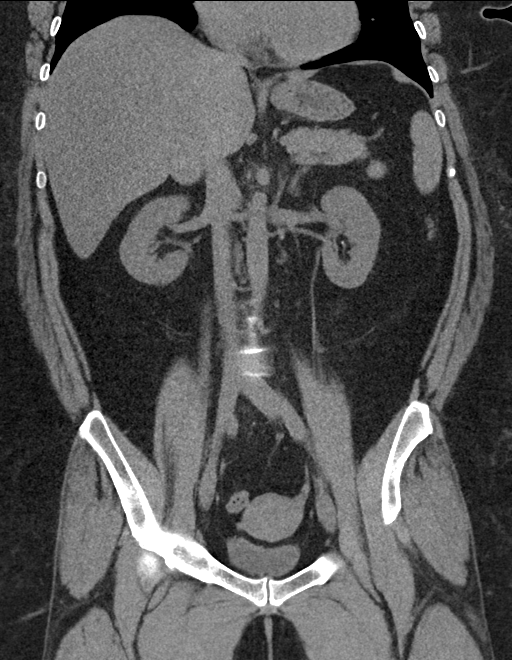

[16 of 46 positions shown; findings below may reference images not displayed]

FINDINGS: Lower chest: No acute abnormality.

Hepatobiliary: Diffusely decreased liver density. No focal
abnormality. The gallbladder is unremarkable. No biliary dilatation.

Pancreas: Unremarkable. No pancreatic ductal dilatation or
surrounding inflammatory changes.

Spleen: Normal in size without focal abnormality.

Adrenals/Urinary Tract: Adrenal glands are unremarkable. New small
bilateral renal calculi. No ureteral calculi or hydronephrosis. The
bladder is unremarkable for the degree of distention.

Stomach/Bowel: Stomach is within normal limits. Prior appendectomy.
No evidence of bowel wall thickening, distention, or inflammatory
changes.

Vascular/Lymphatic: No significant vascular findings are present. No
enlarged abdominal or pelvic lymph nodes.

Reproductive: Uterus and bilateral adnexa are unremarkable.

Other: New moderate fat containing umbilical hernia. No free fluid
or pneumoperitoneum.

Musculoskeletal: No acute or significant osseous findings.
IMPRESSION: 1. No acute intra-abdominal process.
2. New bilateral nonobstructive nephrolithiasis.
3. Hepatic steatosis.
4. New moderate fat containing umbilical hernia.
